# Patient Record
Sex: Female | Born: 1995 | Hispanic: Yes | Marital: Single | Smoking: Never smoker
Health system: Southern US, Community
[De-identification: ages and names within clinical notes are randomized; demographics above are authoritative.]

## PROBLEM LIST (undated history)

## (undated) DIAGNOSIS — F4310 Post-traumatic stress disorder, unspecified: Secondary | ICD-10-CM

## (undated) DIAGNOSIS — Z789 Other specified health status: Secondary | ICD-10-CM

## (undated) DIAGNOSIS — F4010 Social phobia, unspecified: Secondary | ICD-10-CM

## (undated) HISTORY — PX: BACK SURGERY: SHX140

---
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
## 2017-04-21 IMAGING — CT CT MAXILLOFACIAL W/O CM
3 series · 15 of 47 positions shown, 18 images · non-contrast
Comparison: None.

CLINICAL DATA: Pain and swelling in the right mandibular region, 1
year duration.

EXAM:
CT MAXILLOFACIAL WITHOUT CONTRAST
TECHNIQUE: Multidetector CT imaging of the maxillofacial structures was
performed. Multiplanar CT image reconstructions were also generated.
A small metallic BB was placed on the right temple in order to
reliably differentiate right from left.

[Series 4: maxofacial soft · axial · 0.29mm/px · z∈[-124,+23]mm · 9 of 57 slices shown, 12 images]
[im 4/57  brain]
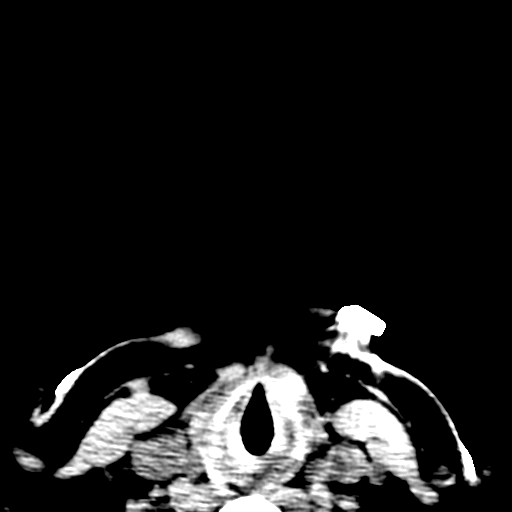
[im 4/57  bone]
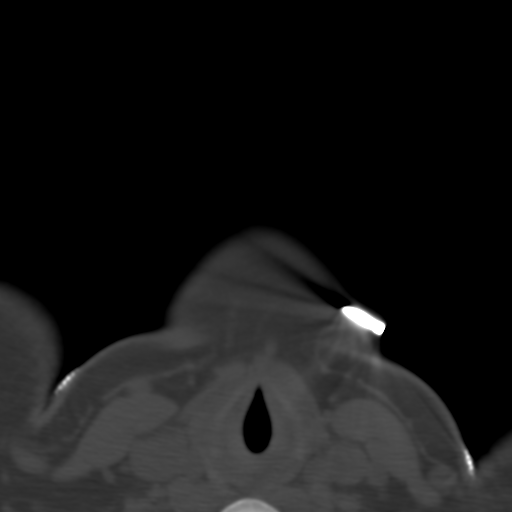
[im 10/57  bone]
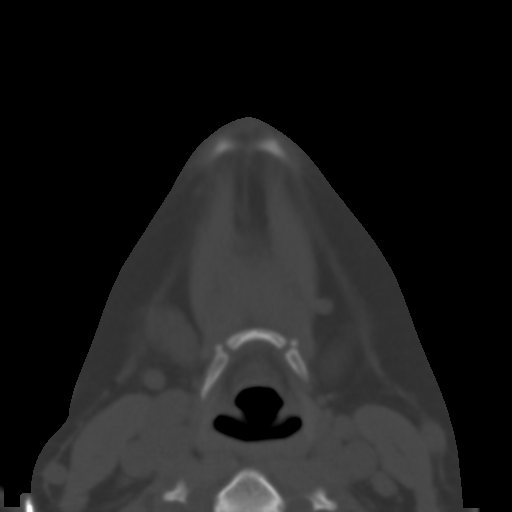
[im 16/57  bone]
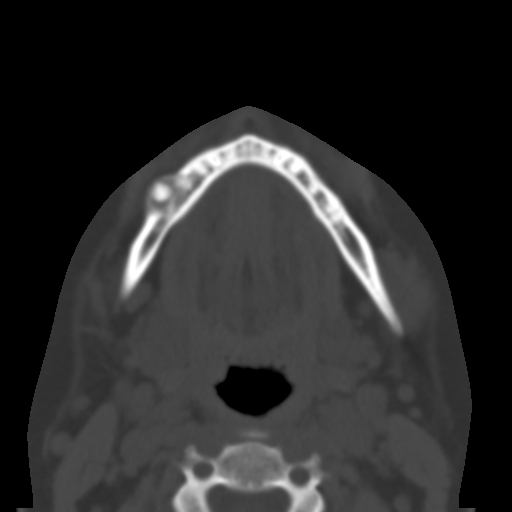
[im 22/57  bone]
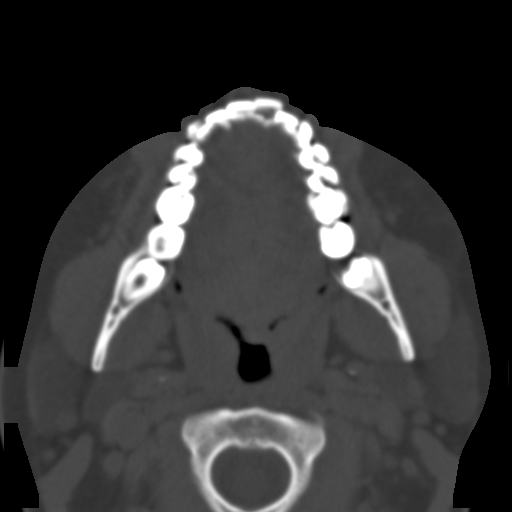
[im 29/57  brain]
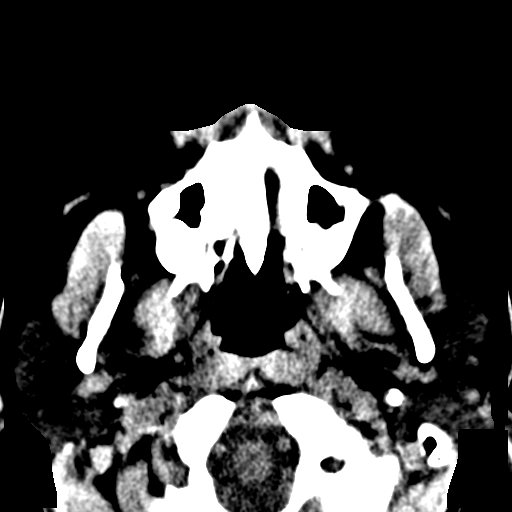
[im 29/57  bone]
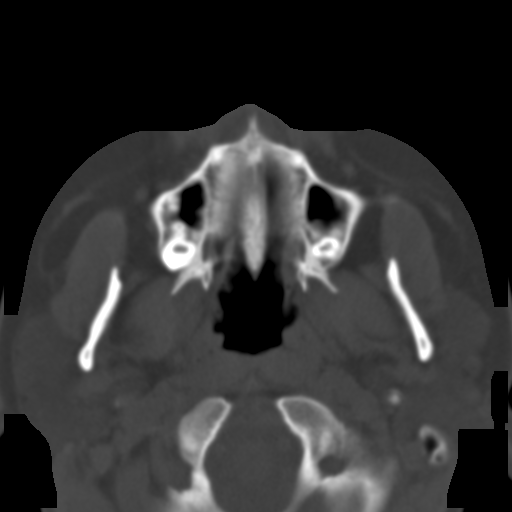
[im 35/57  bone]
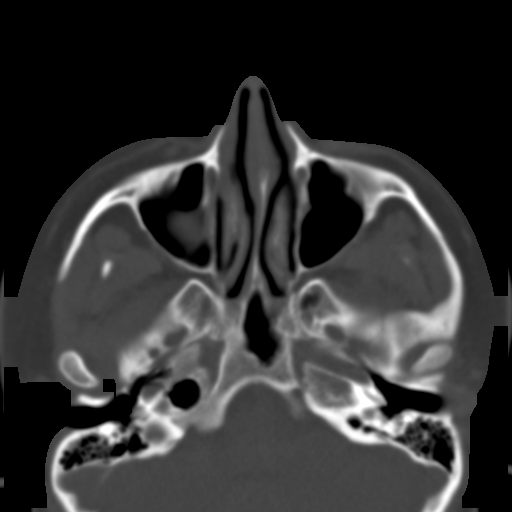
[im 41/57  bone]
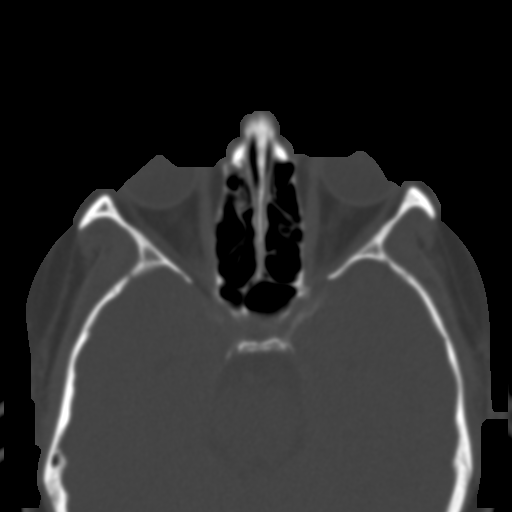
[im 47/57  bone]
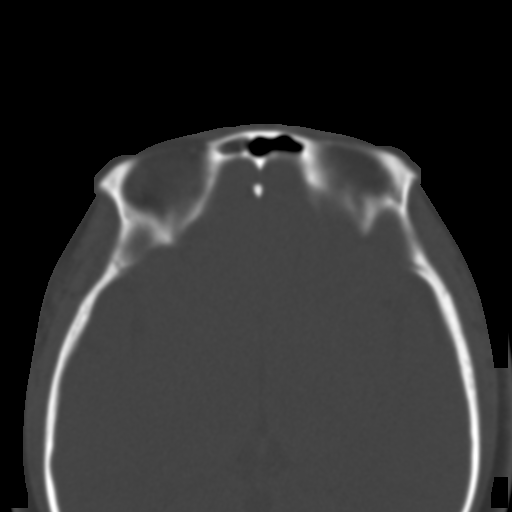
[im 53/57  brain]
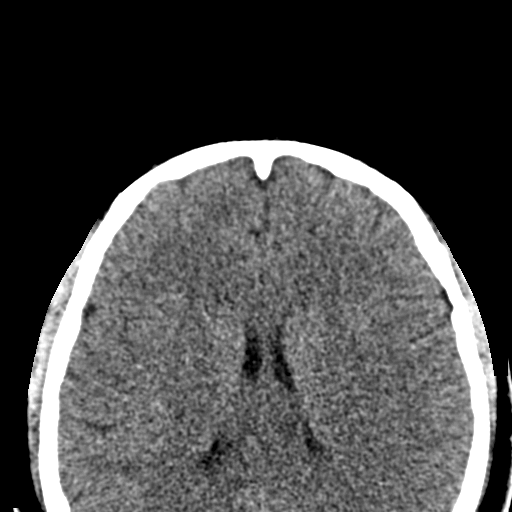
[im 53/57  bone]
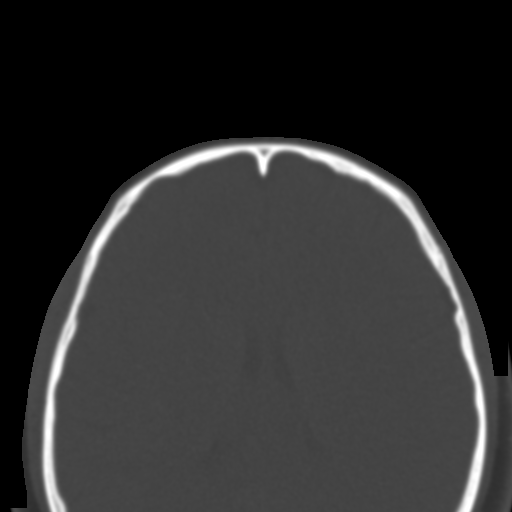

[Series 7: cor soft · coronal · 0.32mm/px · 3 of 69 slices shown]
[im 23/69  bone]
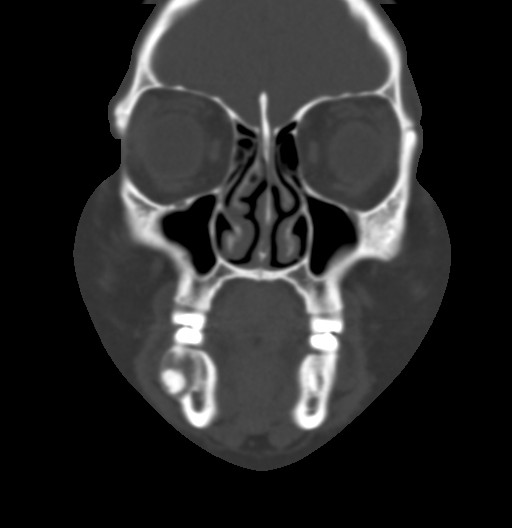
[im 31/69  bone]
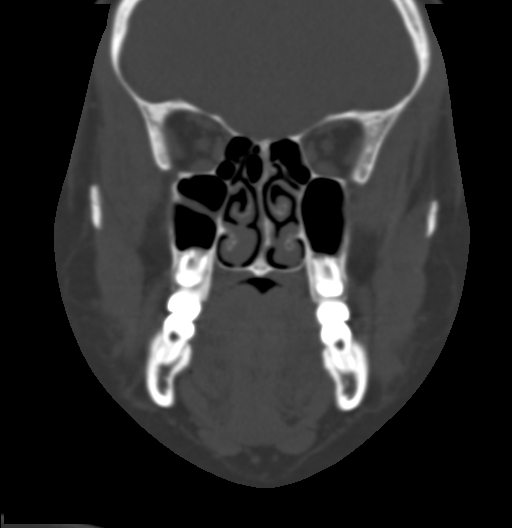
[im 38/69  bone]
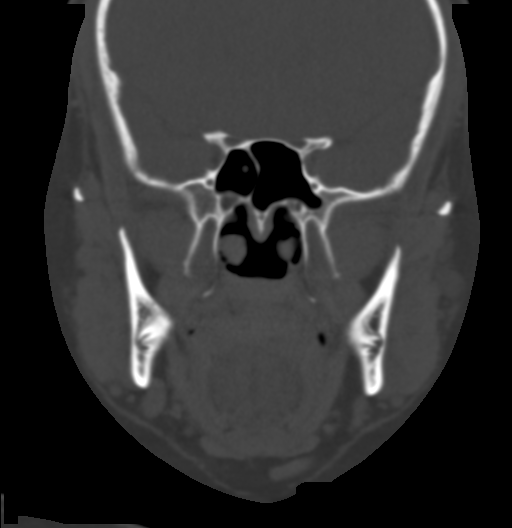

[Series 8: sag soft · sagittal · 0.27mm/px · 3 of 78 slices shown]
[im 26/78  bone]
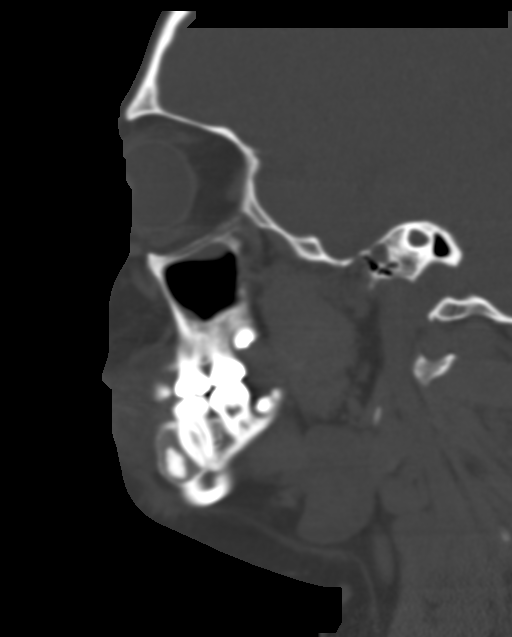
[im 39/78  bone]
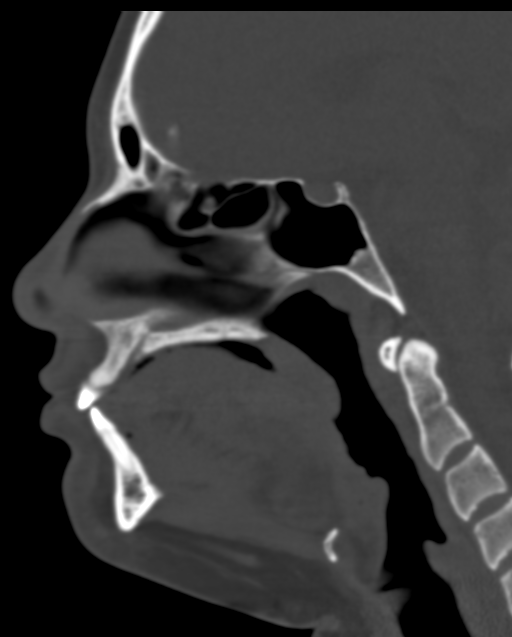
[im 52/78  bone]
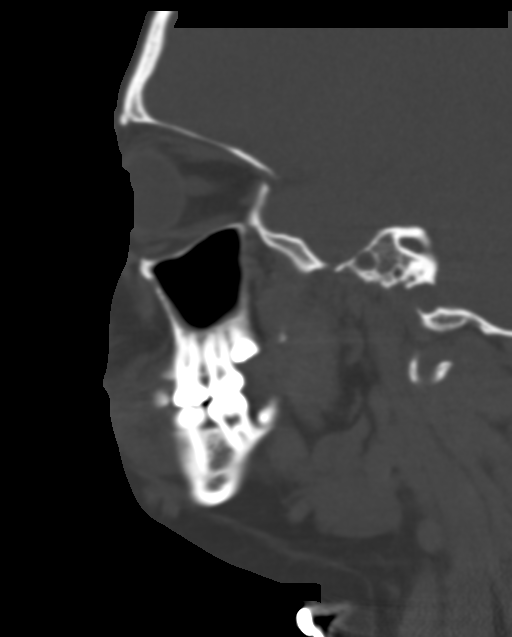

[15 of 47 positions shown; findings below may reference images not displayed]

FINDINGS: There is a 1.5 cm in size expansile lesion of the body of the
mandible on the right adjacent to an between the roots of teeth 29
and 30, more closely associated with number 30. Internally, there is
ground-glass opacity as well as a 6 mm focus of more dense
component. No definite cortical breakthrough. Adjacent soft tissues
are unremarkable. I think the most likely possibilities are odontoma
versus odontogenic myxoma. The presence of the more hyperdense
component is strongly suggestive of these diagnoses. Less likely
possibilities include ameloblastoma and odontogenic keratocyst, but
those are not usually associated with the hyperdense component.

The remainder of the mandible and maxilla are normal. No other soft
tissue finding. Mild mucosal thickening of the paranasal sinuses
without advanced sinusitis.
IMPRESSION: 1.5 cm expansile lesion affecting the body of the mandible on the
right, expanding the lateral cortex. This located between the roots
of teeth 29 and 30. See above for full discussion. Favored diagnosis
is odontoma versus odontogenic myxoma.

## 2017-11-11 ENCOUNTER — Ambulatory Visit: Payer: Self-pay | Admitting: Obstetrics & Gynecology

## 2017-11-13 ENCOUNTER — Ambulatory Visit: Payer: BLUE CROSS/BLUE SHIELD | Admitting: Obstetrics & Gynecology

## 2017-11-13 ENCOUNTER — Encounter: Payer: Self-pay | Admitting: Obstetrics & Gynecology

## 2017-11-13 VITALS — BP 130/84 | Ht 64.5 in | Wt 233.0 lb

## 2017-11-13 DIAGNOSIS — Z113 Encounter for screening for infections with a predominantly sexual mode of transmission: Secondary | ICD-10-CM | POA: Diagnosis not present

## 2017-11-13 DIAGNOSIS — Z01419 Encounter for gynecological examination (general) (routine) without abnormal findings: Secondary | ICD-10-CM

## 2017-11-13 DIAGNOSIS — Z30011 Encounter for initial prescription of contraceptive pills: Secondary | ICD-10-CM | POA: Diagnosis not present

## 2017-11-13 DIAGNOSIS — N921 Excessive and frequent menstruation with irregular cycle: Secondary | ICD-10-CM | POA: Diagnosis not present

## 2017-11-13 MED ORDER — NORETHIN ACE-ETH ESTRAD-FE 1-20 MG-MCG PO TABS: 1.0000 | ORAL_TABLET | Freq: Every day | ORAL | 4 refills | Status: DC
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
## 2017-11-13 NOTE — Patient Instructions (Signed)
1. Well female exam with routine gynecological exam Normal gynecologic exam.  Will start Pap test at age 22.  Breast exam normal.  Recommend regular physical activity with aerobic physical activity 5 times a week and weight lifting every 2 days.  2. Encounter for initial prescription of contraceptive pills Counseling on contraception.  Different method of contraception discussed including the birth control pill, the Nuva ring, the contraceptive patch, the Nexplanon, Depo-Provera, the IUD with and without progesterone.  Patient prefers the birth control pill.  Usage, risks and benefits thoroughly discussed.  Risks of blood clots and high blood pressure reviewed.  Importance of compliance discussed.  Patient voiced understanding and agreement with plan.  3. Screen for STD (sexually transmitted disease) Strict condom use strongly recommended. - HIV antibody (with reflex) - RPR - Hepatitis B Surface AntiGEN - Hepatitis C Surface AntiGEN - C. trachomatis/N. gonorrhoeae RNA  4. Menometrorrhagia Possible tendency for an ovulation.  Rule out anemia, thyroid dysfunction and hyperprolactinemia.  Will also follow-up for a pelvic ultrasound to make sure that the abnormal bleeding is not caused by fibroids or polyps.  Rule out pregnancy. - CBC - TSH - Prolactin - US Transvaginal Non-OB; Future - hCG, serum, qualitative  Other orders - norethindrone-ethinyl estradiol (JUNEL FE,GILDESS FE,LOESTRIN FE) 1-20 MG-MCG tablet; Take 1 tablet by mouth daily.  Lesly Rubenstein, fue un placer conocerle hoy!  Voy a informarle de sus [employer].   Informacin sobre los Electronics engineer (Oral Contraception Information) Los anticonceptivos orales (ACO) son medicamentos que se utilizan para Therapist, occupational. Su funcin es [employer] ovarios liberen vulos. Las hormonas de los ACO tambin hacen que el moco cervical se haga ms espeso, lo que evita que el esperma ingrese al tero. Tambin hacen que la  membrana que recubre internamente al tero se vuelva ms fina, lo que no permite que el huevo fertilizado se adhiera a la pared del tero. Los ACO son muy efectivos cuando se toman exactamente como se prescriben. Sin embargo, no previenen contra las enfermedades de transmisin sexual (ETS). La prctica del sexo seguro, como el uso de preservativos, junto con la [place], [place] a prevenir ese tipo de enfermedades. Antes de tomar la pldora, usted debe hacerse un examen fsico y un test de Pap. El mdico podr indicarle anlisis de [place], si es necesario. El mdico se asegurar de que usted sea Sneads buena candidata para usar anticonceptivos orales. Converse con su mdico acerca de los posibles efectos secundarios de los ACO que podran recetarle. Cuando se inicia el uso de ACO, puede llevar 2 a 3 meses para que su organismo se adapte a los cambios en los niveles hormonales. TIPOS DE ANTICONCEPTIVOS ORALES  Pldora combinada: esta pldora contiene las hormonas estrgeno y progestina (progesterona sinttica). La pldora combinada viene en envases para [address] [place]. Algunos tipos de pldoras combinadas deben tomarse de manera continua (pldoras para 365 das). En los envases para [address], usted no tomar las pldoras durante 7 das despus de la ltima pldora. En los envases para [address] la pldora se toma [employer]. Las ltimas 7 no contienen hormonas. Ciertos tipos de pldoras tienen ms de 21 pldoras que contienen hormonas. En los envases para [address] las primeras 84 pldoras contienen ambas hormonas y las ltimas 7 pldoras no contienen hormonas o contienen slo Therapist, occupational.  La minipldora: esta pldora contiene la hormona progesterona solamente. Es necesario tomarla todos los das de [place] continua. Es [employer]  tome a la [employer]. Viene en envases de 28 pldoras. Las 28 pldoras contienen la hormona. Lucas los  sntomas premenstruales.  Se [place] para tratar los [employer].  Regula el ciclo menstrual.  Disminuye el ciclo menstrual abundante.  Puede mejorar el acn, segn el tipo de pldora.  Trata hemorragias uterinas anormales.  Trata el sndrome ovrico poliqustico.  Trata la endometriosis.  Pueden usarse como anticonceptivo de Freight forwarder. FACTORES QUE PUEDEN HACER QUE LOS ANTICONCEPTIVOS ORALES SEAN MENOS EFECTIVOS Pueden ser menos efectivos si:  Olvid tomar la [employer] a la misma hora.  Tiene una enfermedad estomacal o intestinal que disminuye la absorcin de la pldora.  Ingiere simultneamente los anticonceptivos orales junto con otros medicamentos que los hacen menos efectivos, como antibiticos, ciertos medicamentos para el VIH y algunos medicamentos para las convulsiones.  Usted toma anticonceptivos orales que han vencido.  Cuando se [place] el envase de 21 das, se olvida de recomenzar el uso [employer] 7. RIESGOS ASOCIADOS AL USO DE ANTICONCEPTIVOS ORALES Los anticonceptivos orales pueden en algunos casos causar efectos secundarios como:  Dolor de [place].  Nuseas.  Inflamacin mamaria.  Hemorragia vaginal o manchado irregular. Las pldoras combinadas tambin se asocian a un pequeo aumento en el riesgo de:  Cogulos sanguneos.  Ataque cardaco.  Ictus. Esta informacin no tiene Marine scientist el consejo del mdico. Asegrese de hacerle al mdico cualquier pregunta que tenga. Document Released: 05/08/2005 Document Revised: 11/20/2015 Document Reviewed: 01/17/2013 Elsevier Interactive Patient Education  2018 [place] preventivos en las mujeres adultas jvenes Preventive Care for [place], Female La transicin hacia la vida de adulto joven despus de la escuela secundaria puede ser una poca estresante que implica muchos cambios. Ahora puede comenzar a Teacher, adult education a un mdico de atencin Audiological scientist de un pediatra.  Este es el momento en que el cuidado de la salud se convierte en su responsabilidad. Los cuidados preventivos hacen referencia a las opciones en cuanto al estilo de vida y a las visitas al mdico, las cuales pueden promover la salud y Musician. Qu incluyen los cuidados preventivos?  Un examen fsico anual. Esto tambin se conoce como visita de control de bienestar anual.  Exmenes dentales [place] al ao.  Exmenes de la vista de rutina. Pregntele al mdico con qu frecuencia debe realizarse un control de la vista.  Opciones personales de estilo de vida, que incluyen lo siguiente: ? [employer] y las encas a diario. ? Realizar actividad fsica con regularidad. ? Tener una dieta saludable. ? Evitar el consumo de tabaco y drogas. ? Evitar o limitar el consumo de alcohol. ? Counsellor. ? Tomar los suplementos de vitaminas o minerales como se lo haya indicado el mdico. Qu sucede durante una visita de control de bienestar anual? Los cuidados preventivos comienzan con una visita anual a su mdico de Midwife. Los servicios y exmenes de deteccin realizados por su mdico durante la visita de control de Therapist, sports anual dependern de su salud general, factores de riesgo de estilo de vida y los antecedentes familiares de enfermedades. Asesoramiento Su mdico puede preguntarle acerca de:  Problemas mdicos anteriores y sus antecedentes mdicos familiares.  Los medicamentos o suplementos que toma.  Seguro mdico y Paramedic a la atencin mdica.  Consumo de tabaco, alcohol y drogas.  Su seguridad en el hogar, el trabajo o la escuela.  Acceso a armas  de fuego.  Su bienestar emocional y cmo Arboriculturist.  El bienestar de las relaciones personales.  Hbitos de alimentacin, ejercicio y sueo.  Su salud y [place] sexual.  Sus mtodos de control de la natalidad (mtodo anticonceptivo).  Su ciclo menstrual.  Sus antecedentes de  Media planner.  Pruebas de deteccin Pueden hacerle las siguientes pruebas o mediciones:  Estatura, peso e ndice de masa muscular [facility]).  Presin arterial.  Niveles de lpidos y colesterol.  Prueba cutnea de tuberculosis.  Examen de la piel.  Pruebas de la vista y la audicin.  Prueba de deteccin de hepatitis.  Pruebas de deteccin de enfermedades de transmisin sexual (ETS), si est en riesgo.  Pruebas de deteccin de cncer relacionado con las mutaciones del BRCA. Es posible que se los deba realizar si tiene antecedentes de cncer de mama, de ovario, de trompas o peritoneal.  Examen plvico y prueba de Papanicolaou. Esto se puede realizar cada 62aos a Renato Gails de los 21aos de edad.  Vacunas El mdico puede recomendarle que se aplique algunas vacunas, por ejemplo:  Vacuna contra la gripe. Se recomienda aplicarse esta vacuna todos los aos.  Vacuna contra la difteria, ttanos y tos Dietitian (DTPa, DT). Es posible que tenga que aplicarse un refuerzo contra el ttanos y la difteria (DT) cada 10aos.  Vacuna contra la varicela. Es posible que tenga que aplicrsela si no recibi esta vacuna.  Lucile Crater el VPH. Si es menor de 26aos, puede que necesite aplicarse tres dosis en un periodo de 27mses.  Vacuna contra el sarampin, rubola y paperas (SRP). Tendr que aplicarse por lo menos una dosis de la SRP. Podra tambin necesitar una segunda dosis.  Vacuna antineumoccica conjugada 13 valente (PCV13). Puede necesitar esta vacuna si tiene determinadas enfermedades y no se vacun anteriormente.  Vacuna antineumoccica de polisacridos (PPSV23). Quizs tenga que aplicarse una o dos dosis si fuma o si sufre determinadas enfermedades.  Vacuna antimeningoccica. Se recomienda la aplicacin de uArdelia Memsdosis si tiene entre 19 y 21aos, y es estudiante universitario de pPsychologist, educationalao que vive en una residencia estudiantil, o si tiene una de varias enfermedades. Podra tambin  necesitar dosis de refuerzo.  Vacuna contra la hepatitis A. Es posible que necesite esta vacuna si tiene ciertas afecciones o si viaja o trabaja en lugares en los que podra estar expuesto a la hepatitis A.  Vacuna contra la hepatitis B. Es posible que necesite esta vacuna si tiene ciertas afecciones o si viaja o trabaja en lugares en los que podra estar expuesto a la hepatitis B.  Vacuna contra antihaemophilus influenzae tipoB (Hib). Es posible que necesite esta vacuna si tiene determinados factores de rHenderson  Hable con el mdico sobre qu pruebas de deteccin y qu vacunas necesita, y con qu frecuencia las necesita. Qu pasos debo seguir para desarrollar conductas saludables?  Realice visitas peridicas de cuidados de salud preventivos al mdico de atencin primaria y al dentista.  Tenga una dieta saludable.  Beba suficiente lquido para mConsulting civil engineerorina clara o de color amarillo plido.  Mantngase activa. Haga al menos 361mutos de ejercicio 5o ms daHilton Hotels Consuma alcohol en forma responsable.  Mantenga un peso saludable.  No use ningn producto que contenga nicotina, como cigarrillos, tabaco de maHigher education careers adviser ciPsychologist, sport and exerciseSi necesita ayuda para dejar de fumar, consulte al mdico.  No consuma drogas.  Practique el sexo seguro.  Utilice mtodos de control de la natalidad (anticonceptivos) para evitar un embarazo no deseado. Si busca un  embarazo, realice una consulta previa a la concepcin con el mdico.  Busque formas saludables para Scientific laboratory technician. Cmo puedo protegerme de las lesiones? Las lesiones producidas en situaciones violentas o accidentes son la causa principal de muerte entre adultos jvenes y, con frecuencia, se Museum/gallery exhibitions officer. Siga estos pasos para protegerse:  Use siempre el cinturn de seguridad al conducir o viajar en un vehculo.  No conduzca si estuvo bebiendo alcohol. No viaje con un conductor que ha estado bebiendo  alcohol.  No conduzca cuando est cansada o distrada. No enve mensajes de texto mientras conduce.  Use un casco y otros equipos de proteccin durante actividades deportivas.  Si tiene armas de fuego en su casa, asegrese de seguir todos los procedimientos de seguridad correspondientes.  Busque ayuda si fue vctima de acoso, abuso fsico o abuso sexual.  Programmer, systems Internet con responsabilidad para evitar peligros, como el acoso y los depredadores sexuales en lnea.  Qu puedo hacer para manejar el estrs? Los adultos jvenes pueden enfrentar muchos desafos nuevos que pueden ser estresantes, Games developer un [place], ir a la [place], [employer] de la casa de [place], Architectural technologist dinero, tener una relacin de [place], casarse y tener hijos. Para controlar el estrs puede hacer lo siguiente:  Evite situaciones estresantes conocidas siempre que pueda.  Haga ejercicios regularmente.  Encuentre una actividad que reduzca el estrs y que funcione mejor para usted. Los ejemplos [employer], yoga, Conservation officer, nature o leer.  Pase tiempo en la naturaleza.  Lleve un registro diario sobre su estrs y cmo usted Psychiatrist.  Consulte al [employer]. El mdico puede sugerirle que reciba asistencia psicolgica.  Pase tiempo con amigos o familiares que le brinden apoyo.  No intente controlar el estrs: ? Consumiendo alcohol o drogas. ? Fumando. ? Comiendo.  Dnde puedo obtener ms informacin? Obtenga ms informacin sobre cuidados preventivos y hbitos saludables aqu:  Colegio Estadounidense de [place] y Youth worker ([place], broadcasting/film/video of Obstetricians and Gynecologists): KaraokeExchange.nl  [place] de Associate Professor de Servicios Preventivos de IT trainer. (U.S. Therapist, occupational): StageSync.si  WESCO International de Informacin de Salud de Adolescentes y Engineer, technical sales (National  Adolescent and [place]): StrategicRoad.nl  [place] ([place] Academy of Pediatrics Bright Futures): https://brightfutures.MemberVerification.co.za  Sociedad de Salud y Lilly (Society for Adolescent Health and Medicine): MoralBlog.co.za.aspx  PodExchange.nl: ToyLending.fr  Esta informacin no tiene como fin reemplazar el consejo del mdico. Asegrese de hacerle al mdico cualquier pregunta que tenga. Document Released: 12/10/2016 Document Revised: 12/10/2016 Document Reviewed: 12/14/2015 Elsevier Interactive Patient Education  2018 [employer].

## 2017-11-14 LAB — CBC
HEMATOCRIT: 37 % (ref 35.0–45.0)
HEMOGLOBIN: 12 g/dL (ref 11.7–15.5)
MCH: 25.7 pg — AB (ref 27.0–33.0)
MCHC: 32.4 g/dL (ref 32.0–36.0)
MCV: 79.2 fL — AB (ref 80.0–100.0)
MPV: 10.7 fL (ref 7.5–12.5)
Platelets: 333 10*3/uL (ref 140–400)
RBC: 4.67 10*6/uL (ref 3.80–5.10)
RDW: 14 % (ref 11.0–15.0)
WBC: 9.8 10*3/uL (ref 3.8–10.8)

## 2017-11-14 LAB — HEPATITIS C ANTIBODY
HEP C AB: NONREACTIVE
SIGNAL TO CUT-OFF: 0.01 (ref <1.00)

## 2017-11-14 LAB — HIV ANTIBODY (ROUTINE TESTING W REFLEX): HIV 1&2 Ab, 4th Generation: NONREACTIVE

## 2017-11-14 LAB — C. TRACHOMATIS/N. GONORRHOEAE RNA
C. trachomatis RNA, TMA: NOT DETECTED
N. GONORRHOEAE RNA, TMA: NOT DETECTED

## 2017-11-14 LAB — PROLACTIN: Prolactin: 7.2 ng/mL

## 2017-11-14 LAB — RPR: RPR Ser Ql: NONREACTIVE

## 2017-11-14 LAB — HCG, SERUM, QUALITATIVE: PREG SERUM: NEGATIVE

## 2017-11-14 LAB — HEPATITIS B SURFACE ANTIGEN: Hepatitis B Surface Ag: NONREACTIVE

## 2017-11-14 LAB — TSH: TSH: 1.5 mIU/L

## 2017-12-15 ENCOUNTER — Encounter: Payer: Self-pay | Admitting: *Deleted

## 2018-04-02 ENCOUNTER — Encounter (HOSPITAL_COMMUNITY): Payer: Self-pay

## 2018-04-02 ENCOUNTER — Emergency Department (HOSPITAL_COMMUNITY): Payer: BLUE CROSS/BLUE SHIELD

## 2018-04-02 ENCOUNTER — Emergency Department (HOSPITAL_COMMUNITY)
Admission: EM | Admit: 2018-04-02 | Discharge: 2018-04-02 | Disposition: A | Discharge status: Home / Self Care | Payer: BLUE CROSS/BLUE SHIELD | Attending: Emergency Medicine | Admitting: Emergency Medicine

## 2018-04-02 ENCOUNTER — Other Ambulatory Visit: Payer: Self-pay

## 2018-04-02 DIAGNOSIS — R197 Diarrhea, unspecified: Secondary | ICD-10-CM | POA: Insufficient documentation

## 2018-04-02 DIAGNOSIS — R112 Nausea with vomiting, unspecified: Secondary | ICD-10-CM | POA: Diagnosis not present

## 2018-04-02 DIAGNOSIS — R1084 Generalized abdominal pain: Secondary | ICD-10-CM | POA: Insufficient documentation

## 2018-04-02 LAB — CBC
HEMATOCRIT: 42.8 % (ref 36.0–46.0)
HEMOGLOBIN: 13.3 g/dL (ref 12.0–15.0)
MCH: 24.9 pg — ABNORMAL LOW (ref 26.0–34.0)
MCHC: 31.1 g/dL (ref 30.0–36.0)
MCV: 80.1 fL (ref 78.0–100.0)
Platelets: 300 10*3/uL (ref 150–400)
RBC: 5.34 MIL/uL — ABNORMAL HIGH (ref 3.87–5.11)
RDW: 17.1 % — ABNORMAL HIGH (ref 11.5–15.5)
WBC: 10.9 10*3/uL — ABNORMAL HIGH (ref 4.0–10.5)

## 2018-04-02 LAB — COMPREHENSIVE METABOLIC PANEL
ALBUMIN: 3.8 g/dL (ref 3.5–5.0)
ALT: 24 U/L (ref 0–44)
ANION GAP: 10 (ref 5–15)
AST: 24 U/L (ref 15–41)
Alkaline Phosphatase: 92 U/L (ref 38–126)
BILIRUBIN TOTAL: 0.5 mg/dL (ref 0.3–1.2)
BUN: 9 mg/dL (ref 6–20)
CO2: 25 mmol/L (ref 22–32)
Calcium: 9.5 mg/dL (ref 8.9–10.3)
Chloride: 106 mmol/L (ref 98–111)
Creatinine, Ser: 0.74 mg/dL (ref 0.44–1.00)
GFR calc Af Amer: >60 mL/min (ref >60)
GFR calc non Af Amer: >60 mL/min (ref >60)
GLUCOSE: 103 mg/dL — AB (ref 70–99)
POTASSIUM: 4 mmol/L (ref 3.5–5.1)
SODIUM: 141 mmol/L (ref 135–145)
TOTAL PROTEIN: 7.6 g/dL (ref 6.5–8.1)

## 2018-04-02 LAB — I-STAT BETA HCG BLOOD, ED (MC, WL, AP ONLY): I-stat hCG, quantitative: <5 m[IU]/mL (ref <5)

## 2018-04-02 LAB — LIPASE, BLOOD: Lipase: 23 U/L (ref 11–51)

## 2018-04-02 MED ORDER — IOPAMIDOL (ISOVUE-300) INJECTION 61%
100.0000 mL | Freq: Once | INTRAVENOUS | Status: AC | PRN
  Administered 2018-04-02: 100 mL via INTRAVENOUS

## 2018-04-02 MED ORDER — ONDANSETRON 8 MG PO TBDP: 8.0000 mg | ORAL_TABLET | Freq: Three times a day (TID) | ORAL | 0 refills | Status: DC | PRN

## 2018-04-02 MED ORDER — IOPAMIDOL (ISOVUE-300) INJECTION 61%
INTRAVENOUS | Status: AC
  Filled 2018-04-02: qty 100

## 2018-04-02 MED ORDER — SODIUM CHLORIDE 0.9 % IV BOLUS
1000.0000 mL | Freq: Once | INTRAVENOUS | Status: AC
  Administered 2018-04-02: 1000 mL via INTRAVENOUS

## 2018-04-02 NOTE — ED Notes (Signed)
One unsuccessful iv attempt made.

## 2018-04-02 NOTE — ED Provider Notes (Signed)
[facility] DEPT Provider Note   CSN: [id number] Arrival date & time: 04/02/18  1352     History   Chief Complaint Chief Complaint  Patient presents with  . Emesis  . Abdominal Pain  . Abnormal Lab  . Headache    HPI Amy Mercer is a 22 y.o. female.  HPI  Amy Mercer is a 22 y.o. female presents to emergency department complaining of abdominal pain.  Patient states she has had 3 days of diffuse abdominal cramping, nausea, vomiting, diarrhea.  She states she is having a loose bowel movement every hour.  She went to her primary care doctor yesterday, had some blood work done and was told to come here for further evaluation today.  I reviewed the blood work, and the only main abnormality is patient's white blood cell count was 20.  Patient states she is not feeling any better today, she continues to have nausea, vomiting, diarrhea and abdominal pain.  She is not pregnant.  She was prescribed "some type of medication for my symptoms but they are not helping."  She denies any fever or chills.  No urinary symptoms.  No prior abdominal issues or surgeries.  Spanish interpreter used for this visit    Past Medical History:  Diagnosis Date  . Medical history non-contributory   . PTSD (post-traumatic stress disorder) 08/15/2015  . Social anxiety disorder 08/15/2015    Patient Active Problem List   Diagnosis Date Noted  . MDD (major depressive disorder) 08/15/2015  . Social anxiety disorder 08/15/2015  . PTSD (post-traumatic stress disorder) 08/15/2015  . Vasovagal syncope 09/16/2014  . Transient right leg weakness 09/16/2014  . Numbness in right leg 09/16/2014  . Obesity 09/16/2014    Past Surgical History:  Procedure Laterality Date  . BACK SURGERY    . OTHER SURGICAL HISTORY  2006   Infected boil removed from the back of her neck and continued needing to have 3 additional surgeries at the age of 61.     OB History    Gravida  0   Para  0   Term  0   Preterm  0   AB  0   Living  0     SAB  0   TAB  0   Ectopic  0   Multiple  0   Live Births  0            Home Medications    Prior to Admission medications   Medication Sig Start Date End Date Taking? Authorizing Provider  norethindrone-ethinyl estradiol (JUNEL FE,GILDESS FE,LOESTRIN FE) 1-20 MG-MCG tablet Take 1 tablet by mouth daily. 11/13/17   Princess Bruins, MD    Family History Family History  Problem Relation Age of Onset  . Heart attack Paternal Grandmother        Died at 55    Social History Social History   Tobacco Use  . Smoking status: Never Smoker  . Smokeless tobacco: Never Used  Substance Use Topics  . Alcohol use: Yes    Alcohol/week: 0.0 standard drinks    Comment: RARE  . Drug use: No     Allergies   Other   Review of Systems Review of Systems  Constitutional: Negative for chills and fever.  Respiratory: Negative for cough, chest tightness and shortness of breath.   Cardiovascular: Negative for chest pain, palpitations and leg swelling.  Gastrointestinal: Positive for abdominal pain, nausea and vomiting. Negative for diarrhea.  Genitourinary: Negative for dysuria, flank pain, pelvic pain, vaginal bleeding, vaginal discharge and vaginal pain.  Musculoskeletal: Negative for arthralgias, myalgias, neck pain and neck stiffness.  Skin: Negative for rash.  Neurological: Negative for dizziness, weakness and headaches.  All other systems reviewed and are negative.    Physical Exam Updated Vital Signs BP 127/77 (BP Location: Right Arm)   Pulse 92   Temp 98 F (36.7 C) (Oral)   Resp 16   Ht 5\' 5"  (1.651 m)   Wt 108.9 kg   LMP 02/28/2018   SpO2 100%   BMI 39.94 kg/m   Physical Exam  Constitutional: She appears well-developed and well-nourished. No distress.  HENT:  Head: Normocephalic.  Eyes: Conjunctivae are normal.  Neck: Neck supple.  Cardiovascular: Normal rate, regular rhythm  and normal heart sounds.  Pulmonary/Chest: Effort normal and breath sounds normal. No respiratory distress. She has no wheezes. She has no rales.  Abdominal: Soft. Bowel sounds are normal. She exhibits no distension. There is generalized tenderness. There is no rigidity, no rebound and no guarding.  Musculoskeletal: She exhibits no edema.  Neurological: She is alert.  Skin: Skin is warm and dry.  Psychiatric: She has a normal mood and affect. Her behavior is normal.  Nursing note and vitals reviewed.    ED Treatments / Results  Labs (all labs ordered are listed, but only abnormal results are displayed) Labs Reviewed  COMPREHENSIVE METABOLIC PANEL - Abnormal; Notable for the following components:      Result Value   Glucose, Bld 103 (*)    All other components within normal limits  CBC - Abnormal; Notable for the following components:   WBC 10.9 (*)    RBC 5.34 (*)    MCH 24.9 (*)    RDW 17.1 (*)    All other components within normal limits  LIPASE, BLOOD  URINALYSIS, ROUTINE W REFLEX MICROSCOPIC  I-STAT BETA HCG BLOOD, ED ([facility], [facility], [facility] ONLY)    EKG None  Radiology Ct Abdomen Pelvis W Contrast  Result Date: 04/02/2018 CLINICAL DATA:  Acute generalized abdominal pain, abnormal labs possible pancreatitis, abdominal pain for 2 days, headache, dizziness EXAM: CT ABDOMEN AND PELVIS WITH CONTRAST TECHNIQUE: Multidetector CT imaging of the abdomen and pelvis was performed using the standard protocol following bolus administration of intravenous contrast. Sagittal and coronal MPR images reconstructed from axial data set. CONTRAST:  170mL ISOVUE-300 IOPAMIDOL (ISOVUE-300) INJECTION 61% IV. No oral contrast. COMPARISON:  None FINDINGS: Lower chest: Lung bases clear Hepatobiliary: Gallbladder and liver normal appearance Pancreas: Normal appearance. Specifically no peripancreatic edema to suggest pancreatitis. No pancreatic mass or abnormal fluid collection. Spleen: Normal appearance  Adrenals/Urinary Tract: Adrenal glands normal appearance. Kidneys, ureters, and bladder normal appearance Stomach/Bowel: Normal appendix. Stomach and bowel loops normal appearance Vascular/Lymphatic: Vascular structures unremarkable. No adenopathy. Reproductive: Normal appearing uterus and ovaries Other: No free air free fluid.  No acute inflammatory process. Musculoskeletal: Bones unremarkable. IMPRESSION: Normal exam. Electronically Signed   By: Lavonia Dana M.D.   On: 04/02/2018 18:01    Procedures Procedures (including critical care time)  Medications Ordered in ED Medications  sodium chloride 0.9 % bolus 1,000 mL (has no administration in time range)     Initial Impression / Assessment and Plan / ED Course  I have reviewed the triage vital signs and the nursing notes.  Pertinent labs & imaging results that were available during my care of the patient were reviewed by me and considered in my medical decision making (see  chart for details).    Patient with diffuse abdominal pain and tenderness on exam.  Had a white count of 20 earlier yesterday.  We will recheck labs and get CT abdomen pelvis.   Patient CT is unremarkable.  Most likely gastroenteritis given nausea, vomiting, diarrhea.  She has normal vital signs.  She is drinking fluids with no difficulty.  Discussed results and plan with the patient, discharged home with Zofran, follow-up as needed.  Patient agreed.  Vital signs are stable at time of discharge.  Vitals:   04/02/18 1413 04/02/18 1415 04/02/18 1919  BP: 127/77  119/76  Pulse: 92  89  Resp: 16  16  Temp: 98 F (36.7 C)    TempSrc: Oral    SpO2: 100%  100%  Weight:  108.9 kg   Height:  5\' 5"  (1.651 m)      Final Clinical Impressions(s) / ED Diagnoses   Final diagnoses:  Nausea vomiting and diarrhea    ED Discharge Orders         Ordered    ondansetron (ZOFRAN ODT) 8 MG disintegrating tablet  Every 8 hours PRN     04/02/18 1900           Jeannett Senior, PA-C 04/02/18 2317    Milton Ferguson, MD 04/02/18 2324

## 2018-04-02 NOTE — Discharge Instructions (Addendum)
Drink plenty of fluids. Rest. Zofran as prescribed as needed for nausea and vomiting. Follow up with family doctor as needed.

## 2018-04-02 NOTE — ED Triage Notes (Addendum)
Patient was sent by her physician because of abnornal lab, possible pancreatitis. Patient also c/o headache and dizziness.and abdominal pain x 2 days.   Triage completed using video interpreter [phone]

## 2019-03-15 ENCOUNTER — Other Ambulatory Visit: Payer: Self-pay

## 2019-03-15 DIAGNOSIS — Z20822 Contact with and (suspected) exposure to covid-19: Secondary | ICD-10-CM

## 2019-03-16 LAB — NOVEL CORONAVIRUS, NAA: SARS-CoV-2, NAA: NOT DETECTED

## 2019-03-25 ENCOUNTER — Telehealth (INDEPENDENT_AMBULATORY_CARE_PROVIDER_SITE_OTHER): Payer: Self-pay | Admitting: Primary Care

## 2019-03-25 ENCOUNTER — Encounter (INDEPENDENT_AMBULATORY_CARE_PROVIDER_SITE_OTHER): Payer: Self-pay | Admitting: Primary Care

## 2019-03-25 ENCOUNTER — Other Ambulatory Visit: Payer: Self-pay

## 2019-03-25 DIAGNOSIS — Z7689 Persons encountering health services in other specified circumstances: Secondary | ICD-10-CM

## 2019-03-25 DIAGNOSIS — G43109 Migraine with aura, not intractable, without status migrainosus: Secondary | ICD-10-CM

## 2019-03-25 MED ORDER — SUMATRIPTAN SUCCINATE 50 MG PO TABS: 50.0000 mg | ORAL_TABLET | ORAL | 0 refills | Status: DC | PRN

## 2019-03-25 NOTE — Progress Notes (Signed)
Pt reports headaches with great pressure that cause dizziness. Needs note for work if headache persists.

## 2019-03-25 NOTE — Progress Notes (Signed)
Virtual Visit via Telephone Note  I connected with Amy Mercer on 03/25/19 at  9:50 AM EDT by WEB and verified that I am speaking with the correct person using two identifiers.   I discussed the limitations, risks, security and privacy concerns of performing an evaluation and management service by telephone and the availability of in person appointments. I also discussed with the patient that there may be a patient responsible charge related to this service. The patient expressed understanding and agreed to proceed.   History of Present Illness: Ms.Amy Mercer is establishing care. She is concern that her COVID test was negative but she continues to have to have dizzy spells in which the room is spending around and than she has a headache and photosensitivity  Observations/Objective: Review of Systems  Neurological: Positive for dizziness, weakness and headaches.  All other systems reviewed and are negative.   Assessment and Plan: Amy Mercer was seen today for establish care.  Diagnoses and all orders for this visit:  Encounter to establish care Previously receiving care by pediatrician now establing care with new primary care as an adult. 11/13/2017 patient had a well female exam by  Dr. Crosby Oyster.  Migraine with aura and without status migrainosus, not intractable Signs can be individualized with throbbing pain on any area of the head.  They may come with an aura-dizziness, nausea, sensitive to light sound or smell.  Triggers can be caused by drinking alcohol smoking or certain medications, eating and drinking certain products  This condition may be triggered or caused by: Caffeine, aged cheese, and chocolate.  Other orders -     SUMAtriptan (IMITREX) 50 MG tablet; Take 1 tablet (50 mg total) by mouth every 2 (two) hours as needed for migraine. May repeat in 2 hours if headache persists or recurs.    Follow Up Instructions:    I discussed the  assessment and treatment plan with the patient. The patient was provided an opportunity to ask questions and all were answered. The patient agreed with the plan and demonstrated an understanding of the instructions.   The patient was advised to call back or seek an in-person evaluation if the symptoms worsen or if the condition fails to improve as anticipated.  I provided 20 minutes of non-face-to-face time during this encounter.   Kerin Perna, NP

## 2019-04-26 ENCOUNTER — Encounter (INDEPENDENT_AMBULATORY_CARE_PROVIDER_SITE_OTHER): Payer: Self-pay | Admitting: Primary Care

## 2019-04-26 ENCOUNTER — Other Ambulatory Visit (INDEPENDENT_AMBULATORY_CARE_PROVIDER_SITE_OTHER): Payer: Self-pay | Admitting: Primary Care

## 2019-04-26 ENCOUNTER — Telehealth (INDEPENDENT_AMBULATORY_CARE_PROVIDER_SITE_OTHER): Payer: BLUE CROSS/BLUE SHIELD | Admitting: Primary Care

## 2019-04-26 DIAGNOSIS — F32A Depression, unspecified: Secondary | ICD-10-CM

## 2019-04-26 DIAGNOSIS — G43109 Migraine with aura, not intractable, without status migrainosus: Secondary | ICD-10-CM

## 2019-04-26 DIAGNOSIS — F329 Major depressive disorder, single episode, unspecified: Secondary | ICD-10-CM

## 2019-04-26 MED ORDER — SUMATRIPTAN SUCCINATE 50 MG PO TABS: 50.0000 mg | ORAL_TABLET | ORAL | 0 refills | Status: DC | PRN

## 2019-04-26 NOTE — Progress Notes (Signed)
Headaches mainly in the afternoon around 2:30 and lasts all night  Sumatriptan not really helping

## 2019-04-26 NOTE — Progress Notes (Signed)
Virtual Visit via Telephone Note  I connected with Amy Mercer on 04/26/19 at 10:10 AM EDT by telephone and verified that I am speaking with the correct person using two identifiers.   I discussed the limitations, risks, security and privacy concerns of performing an evaluation and management service by telephone and the availability of in person appointments. I also discussed with the patient that there may be a patient responsible charge related to this service. The patient expressed understanding and agreed to proceed.  WEB visit History of Present Illness: [name] is having a web visit for recurrent headaches/migraines. The more questions asked her the headaches occur when she is not at work and nothing to do. We discussed depression and encourage her to exercise find a hobby that she enjoys and I would like to have her in person to discuss how and what's she doing on her time off.   Past Medical History:  Diagnosis Date  . Medical history non-contributory   . PTSD (post-traumatic stress disorder) 08/15/2015  . Social anxiety disorder 08/15/2015   Observations/Objective: Review of Systems  Constitutional: Positive for malaise/fatigue.  Neurological: Positive for dizziness and headaches.  Psychiatric/Behavioral: Positive for depression.    Assessment and Plan: Amy Mercer was seen today for follow-up.  Diagnoses and all orders for this visit:  Migraine with aura and without status migrainosus, not intractable Signs can be individualized with throbbing pain on any area of the head.  They may come with an aura-dizziness, nausea, sensitive to light sound or smell.  Triggers can be caused by drinking alcohol smoking or certain medications, eating and drinking certain products  This condition may be triggered or caused by: Caffeine, aged cheese, and chocolate.  Depression, unspecified depression type Depression is when you feel down, blue or sad for at least 2 weeks  in a row. You may experience increaset increase in sleeping, eating or  loss of interest in doing things that once gave you pleasure. Feeling worthless, guilty, nervous and low self esteem, avoiding interaction with other people or increase agitation.   Follow Up Instructions:    I discussed the assessment and treatment plan with the patient. The patient was provided an opportunity to ask questions and all were answered. The patient agreed with the plan and demonstrated an understanding of the instructions.   The patient was advised to call back or seek an in-person evaluation if the symptoms worsen or if the condition fails to improve as anticipated.  I provided 15 minutes of non-face-to-face time during this encounter.   Kerin Perna, NP

## 2019-09-29 ENCOUNTER — Encounter (HOSPITAL_COMMUNITY): Payer: Self-pay | Admitting: Emergency Medicine

## 2019-09-29 ENCOUNTER — Other Ambulatory Visit: Payer: Self-pay

## 2019-09-29 ENCOUNTER — Emergency Department (HOSPITAL_COMMUNITY)
Admission: EM | Admit: 2019-09-29 | Discharge: 2019-09-29 | Disposition: A | Discharge status: Home / Self Care | Payer: 59 | Attending: Emergency Medicine | Admitting: Emergency Medicine

## 2019-09-29 DIAGNOSIS — R55 Syncope and collapse: Secondary | ICD-10-CM | POA: Insufficient documentation

## 2019-09-29 DIAGNOSIS — Z20822 Contact with and (suspected) exposure to covid-19: Secondary | ICD-10-CM | POA: Insufficient documentation

## 2019-09-29 DIAGNOSIS — R519 Headache, unspecified: Secondary | ICD-10-CM | POA: Diagnosis not present

## 2019-09-29 LAB — CBC WITH DIFFERENTIAL/PLATELET
Abs Immature Granulocytes: 0.05 10*3/uL (ref 0.00–0.07)
Basophils Absolute: 0 10*3/uL (ref 0.0–0.1)
Basophils Relative: 0 %
Eosinophils Absolute: 0.1 10*3/uL (ref 0.0–0.5)
Eosinophils Relative: 1 %
HCT: 45.2 % (ref 36.0–46.0)
Hemoglobin: 13.5 g/dL (ref 12.0–15.0)
Immature Granulocytes: 0 %
Lymphocytes Relative: 26 %
Lymphs Abs: 3 10*3/uL (ref 0.7–4.0)
MCH: 24.8 pg — ABNORMAL LOW (ref 26.0–34.0)
MCHC: 29.9 g/dL — ABNORMAL LOW (ref 30.0–36.0)
MCV: 82.9 fL (ref 80.0–100.0)
Monocytes Absolute: 0.6 10*3/uL (ref 0.1–1.0)
Monocytes Relative: 6 %
Neutro Abs: 7.6 10*3/uL (ref 1.7–7.7)
Neutrophils Relative %: 67 %
Platelets: 319 10*3/uL (ref 150–400)
RBC: 5.45 MIL/uL — ABNORMAL HIGH (ref 3.87–5.11)
RDW: 16.6 % — ABNORMAL HIGH (ref 11.5–15.5)
WBC: 11.4 10*3/uL — ABNORMAL HIGH (ref 4.0–10.5)
nRBC: 0 % (ref 0.0–0.2)

## 2019-09-29 LAB — URINALYSIS, ROUTINE W REFLEX MICROSCOPIC
Bilirubin Urine: NEGATIVE
Glucose, UA: NEGATIVE mg/dL
Hgb urine dipstick: NEGATIVE
Ketones, ur: NEGATIVE mg/dL
Leukocytes,Ua: NEGATIVE
Nitrite: NEGATIVE
Protein, ur: NEGATIVE mg/dL
Specific Gravity, Urine: 1.02 (ref 1.005–1.030)
pH: 5 (ref 5.0–8.0)

## 2019-09-29 LAB — BASIC METABOLIC PANEL
Anion gap: 10 (ref 5–15)
BUN: 12 mg/dL (ref 6–20)
CO2: 23 mmol/L (ref 22–32)
Calcium: 9.3 mg/dL (ref 8.9–10.3)
Chloride: 104 mmol/L (ref 98–111)
Creatinine, Ser: 0.49 mg/dL (ref 0.44–1.00)
GFR calc Af Amer: >60 mL/min (ref >60)
GFR calc non Af Amer: >60 mL/min (ref >60)
Glucose, Bld: 95 mg/dL (ref 70–99)
Potassium: 4 mmol/L (ref 3.5–5.1)
Sodium: 137 mmol/L (ref 135–145)

## 2019-09-29 LAB — PREGNANCY, URINE: Preg Test, Ur: NEGATIVE

## 2019-09-29 LAB — SARS CORONAVIRUS 2 (TAT 6-24 HRS): SARS Coronavirus 2: NEGATIVE

## 2019-09-29 MED ORDER — METOCLOPRAMIDE HCL 5 MG/ML IJ SOLN
10.0000 mg | Freq: Once | INTRAMUSCULAR | Status: AC
  Administered 2019-09-29: 10 mg via INTRAVENOUS
  Filled 2019-09-29: qty 2

## 2019-09-29 MED ORDER — SODIUM CHLORIDE 0.9 % IV BOLUS
1000.0000 mL | Freq: Once | INTRAVENOUS | Status: AC
  Administered 2019-09-29: 1000 mL via INTRAVENOUS

## 2019-09-29 MED ORDER — KETOROLAC TROMETHAMINE 30 MG/ML IJ SOLN
30.0000 mg | Freq: Once | INTRAMUSCULAR | Status: AC
  Administered 2019-09-29: 30 mg via INTRAVENOUS
  Filled 2019-09-29: qty 1

## 2019-09-29 NOTE — Discharge Instructions (Signed)
Stay hydrated as dehydration may cause you to have episodes of dizziness or lightheadedness.  Your blood pressure is normal today. Your labs are normal as well.  Follow up with primary care provider for further management.  You are safe to go home.

## 2019-09-29 NOTE — ED Triage Notes (Signed)
Patient here from work reporting fever and hypertension. States that she felt "funny" at work and staff took BP and it was elevated. Headache x2 days.

## 2019-09-29 NOTE — ED Provider Notes (Signed)
[facility] DEPT Provider Note   CSN: [id number] Arrival date & time: 09/29/19  1050     History Chief Complaint  Patient presents with  . Hypertension    Amy Mercer is a 24 y.o. female.  The history is provided by the patient. The history is limited by a language barrier. A language interpreter was used.  Hypertension     24 year old female with history of PTSD, social anxiety disorder, presenting complaining of headache and dizziness.  Patient is Spanish-speaking, history obtained through language interpreter.  Patient report she has had history of recurrent headache with dizziness for the past 3 to 4 months.  For the past week she has several episodes where she would endorse throbbing bilateral headache follows with feeling lightheadedness, as if she was going to blackout.  Headache is moderate in severity with associated subjective fever, and chills.  She endorsed today having the same symptoms while she was at work, and was seen by a nurse at her facility who reportedly mentioned that patient has high blood pressure and would want her to come to the ER for further evaluation.  She has been taking ibuprofen at home for symptom with some improvement.  She mentioned having been evaluated by her headache in the past by her PCP and was given medication for that which she no longer have.  She does not complain of any runny nose sneezing or coughing no loss of taste or smell no nausea vomiting diarrhea no light or sound sensitivity or rash.  No report of any neck stiffness.  No focal numbness or focal weakness.  No recent sick contact with anyone with COVID-19  Past Medical History:  Diagnosis Date  . Medical history non-contributory   . PTSD (post-traumatic stress disorder) 08/15/2015  . Social anxiety disorder 08/15/2015    Patient Active Problem List   Diagnosis Date Noted  . MDD (major depressive disorder) 08/15/2015  . Social anxiety  disorder 08/15/2015  . PTSD (post-traumatic stress disorder) 08/15/2015  . Vasovagal syncope 09/16/2014  . Transient right leg weakness 09/16/2014  . Numbness in right leg 09/16/2014  . Obesity 09/16/2014    Past Surgical History:  Procedure Laterality Date  . BACK SURGERY    . OTHER SURGICAL HISTORY  2006   Infected boil removed from the back of her neck and continued needing to have 3 additional surgeries at the age of 72.     OB History    Gravida  0   Para  0   Term  0   Preterm  0   AB  0   Living  0     SAB  0   TAB  0   Ectopic  0   Multiple  0   Live Births  0           Family History  Problem Relation Age of Onset  . Heart attack Paternal Grandmother        Died at 21    Social History   Tobacco Use  . Smoking status: Never Smoker  . Smokeless tobacco: Never Used  Substance Use Topics  . Alcohol use: Yes    Alcohol/week: 0.0 standard drinks    Comment: RARE  . Drug use: No    Home Medications Prior to Admission medications   Medication Sig Start Date End Date Taking? Authorizing Provider  SUMAtriptan (IMITREX) 50 MG tablet Take 1 tablet (50 mg total) by mouth every 2 (two) hours  as needed for migraine. May repeat in 2 hours if headache persists or recurs. 04/26/19   Kerin Perna, NP    Allergies    Other  Review of Systems   Review of Systems  All other systems reviewed and are negative.   Physical Exam Updated Vital Signs BP 105/74   Pulse 73   Temp (!) 96.9 F (36.1 C) (Rectal)   Resp 18   SpO2 100%   Physical Exam Vitals and nursing note reviewed.  Constitutional:      General: She is not in acute distress.    Appearance: She is well-developed. She is obese.  HENT:     Head: Atraumatic.     Right Ear: Tympanic membrane normal.     Left Ear: Tympanic membrane normal.     Nose: Nose normal.     Mouth/Throat:     Mouth: Mucous membranes are moist.  Eyes:     Extraocular Movements: Extraocular movements  intact.     Conjunctiva/sclera: Conjunctivae normal.     Pupils: Pupils are equal, round, and reactive to light.  Cardiovascular:     Rate and Rhythm: Normal rate and regular rhythm.     Pulses: Normal pulses.     Heart sounds: Normal heart sounds.  Pulmonary:     Effort: Pulmonary effort is normal.     Breath sounds: Normal breath sounds. No wheezing, rhonchi or rales.  Abdominal:     Palpations: Abdomen is soft.     Tenderness: There is no abdominal tenderness.  Musculoskeletal:     Cervical back: Normal range of motion and neck supple. No rigidity.  Lymphadenopathy:     Cervical: No cervical adenopathy.  Skin:    Findings: No rash.  Neurological:     Mental Status: She is alert and oriented to person, place, and time.     GCS: GCS eye subscore is 4. GCS verbal subscore is 5. GCS motor subscore is 6.     Cranial Nerves: Cranial nerves are intact.     Sensory: Sensation is intact.     Coordination: Coordination is intact.     Gait: Gait is intact.     ED Results / Procedures / Treatments   Labs (all labs ordered are listed, but only abnormal results are displayed) Labs Reviewed  CBC WITH DIFFERENTIAL/PLATELET - Abnormal; Notable for the following components:      Result Value   WBC 11.4 (*)    RBC 5.45 (*)    MCH 24.8 (*)    MCHC 29.9 (*)    RDW 16.6 (*)    All other components within normal limits  SARS CORONAVIRUS 2 (TAT 6-24 HRS)  BASIC METABOLIC PANEL  PREGNANCY, URINE  URINALYSIS, ROUTINE W REFLEX MICROSCOPIC    EKG EKG Interpretation  Date/Time:  Wednesday September 29 2019 13:21:56 EST Ventricular Rate:  85 PR Interval:    QRS Duration: 106 QT Interval:  386 QTC Calculation: 459 R Axis:   73 Text Interpretation: Sinus rhythm RSR' in V1 or V2, right VCD or RVH No old tracing to compare Confirmed by Dorie Rank [phone]) on 09/29/2019 1:45:09 PM   Radiology No results found.  Procedures Procedures (including critical care time)  Medications Ordered  in ED Medications  sodium chloride 0.9 % bolus 1,000 mL (1,000 mLs Intravenous New Bag/Given 09/29/19 1206)  ketorolac (TORADOL) 30 MG/ML injection 30 mg (30 mg Intravenous Given 09/29/19 1207)  metoCLOPramide (REGLAN) injection 10 mg (10 mg Intravenous Given 09/29/19 1207)  ED Course  I have reviewed the triage vital signs and the nursing notes.  Pertinent labs & imaging results that were available during my care of the patient were reviewed by me and considered in my medical decision making (see chart for details).    MDM Rules/Calculators/A&P                      BP 105/74   Pulse 73   Temp (!) 96.9 F (36.1 C) (Rectal)   Resp 18   SpO2 100%   Final Clinical Impression(s) / ED Diagnoses Final diagnoses:  Near syncope  Recurrent headache    Rx / DC Orders ED Discharge Orders    None     11:50 AM Patient report having recurrent headache, dizziness in which she described more as lightheadedness sensation as well as subjective fever and chills for the past week.  Headache appears to be for the past several months.  She does not have any red flags.  No nuchal rigidity on exam to suggest meningitis.  She is otherwise well-appearing.  Normal blood pressure today.  Work-up initiated.  1:11 PM Labs are reassuring, no electrolyte derangement, UA negative, pregnancy test is negative.  To this tachycardia with orthostatic vital signs however blood pressure remains the same.  She is currently receiving IV fluid.  Will check EKG if normal patient can be discharged.  1:46 PM EKG reviewed interpreted by me show no concerning arrhythmia or ischemic changes.  At this time I recommend patient to stay hydrated.  Gave patient reassurance on her blood pressure.  And encourage patient to follow-up with outpatient PCP for further care.   Domenic Moras, PA-C 09/29/19 1349    Dorie Rank, MD 09/30/19 [phone]

## 2019-10-07 ENCOUNTER — Ambulatory Visit: Payer: BLUE CROSS/BLUE SHIELD | Admitting: Nurse Practitioner

## 2019-10-11 ENCOUNTER — Encounter: Payer: Self-pay | Admitting: Nurse Practitioner

## 2019-10-11 ENCOUNTER — Other Ambulatory Visit: Payer: Self-pay

## 2019-10-11 ENCOUNTER — Ambulatory Visit: Payer: 59 | Attending: Nurse Practitioner | Admitting: Nurse Practitioner

## 2019-10-11 DIAGNOSIS — G43709 Chronic migraine without aura, not intractable, without status migrainosus: Secondary | ICD-10-CM

## 2019-10-11 DIAGNOSIS — Z7689 Persons encountering health services in other specified circumstances: Secondary | ICD-10-CM | POA: Diagnosis not present
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
## 2019-10-13 ENCOUNTER — Other Ambulatory Visit: Payer: Self-pay

## 2019-10-13 ENCOUNTER — Ambulatory Visit: Payer: 59 | Attending: Nurse Practitioner

## 2019-10-13 ENCOUNTER — Other Ambulatory Visit: Payer: Self-pay | Admitting: Nurse Practitioner

## 2019-10-13 DIAGNOSIS — E669 Obesity, unspecified: Secondary | ICD-10-CM

## 2019-10-13 DIAGNOSIS — D72829 Elevated white blood cell count, unspecified: Secondary | ICD-10-CM

## 2019-10-14 LAB — CBC WITH DIFFERENTIAL/PLATELET
Basophils Absolute: 0 10*3/uL (ref 0.0–0.2)
Basos: 0 %
EOS (ABSOLUTE): 0.1 10*3/uL (ref 0.0–0.4)
Eos: 1 %
Hematocrit: 43.7 % (ref 34.0–46.6)
Hemoglobin: 13.6 g/dL (ref 11.1–15.9)
Immature Grans (Abs): 0 10*3/uL (ref 0.0–0.1)
Immature Granulocytes: 1 %
Lymphocytes Absolute: 2.4 10*3/uL (ref 0.7–3.1)
Lymphs: 27 %
MCH: 25 pg — ABNORMAL LOW (ref 26.6–33.0)
MCHC: 31.1 g/dL — ABNORMAL LOW (ref 31.5–35.7)
MCV: 80 fL (ref 79–97)
Monocytes Absolute: 0.6 10*3/uL (ref 0.1–0.9)
Monocytes: 7 %
Neutrophils Absolute: 5.7 10*3/uL (ref 1.4–7.0)
Neutrophils: 64 %
Platelets: 347 10*3/uL (ref 150–450)
RBC: 5.44 x10E6/uL — ABNORMAL HIGH (ref 3.77–5.28)
RDW: 15.5 % — ABNORMAL HIGH (ref 11.7–15.4)
WBC: 8.8 10*3/uL (ref 3.4–10.8)

## 2019-10-14 LAB — LIPID PANEL
Chol/HDL Ratio: 4.7 ratio — ABNORMAL HIGH (ref 0.0–4.4)
Cholesterol, Total: 201 mg/dL — ABNORMAL HIGH (ref 100–199)
HDL: 43 mg/dL (ref >39)
LDL Chol Calc (NIH): 138 mg/dL — ABNORMAL HIGH (ref 0–99)
Triglycerides: 112 mg/dL (ref 0–149)
VLDL Cholesterol Cal: 20 mg/dL (ref 5–40)

## 2019-10-14 LAB — HEMOGLOBIN A1C
Est. average glucose Bld gHb Est-mCnc: 128 mg/dL
Hgb A1c MFr Bld: 6.1 % — ABNORMAL HIGH (ref 4.8–5.6)

## 2019-10-29 ENCOUNTER — Ambulatory Visit: Payer: 59 | Attending: Nurse Practitioner | Admitting: Nurse Practitioner

## 2019-10-29 ENCOUNTER — Encounter: Payer: Self-pay | Admitting: Nurse Practitioner

## 2019-10-29 DIAGNOSIS — G43709 Chronic migraine without aura, not intractable, without status migrainosus: Secondary | ICD-10-CM | POA: Diagnosis not present

## 2019-10-29 MED ORDER — AMITRIPTYLINE HCL 10 MG PO TABS: 10.0000 mg | ORAL_TABLET | Freq: Every day | ORAL | 0 refills | Status: DC
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
## 2019-10-29 NOTE — Patient Instructions (Signed)
Cefalea migraosa Migraine Headache Una cefalea migraosa es un dolor muy intenso y punzante en uno o ambos lados de la cabeza. Este tipo de dolor de cabeza tambin puede causar otros sntomas. Puede durar desde 4horas hasta 3das. Hable con su mdico sobre las cosas que pueden causar (desencadenar) esta afeccin. Cules son las causas? Se desconoce la causa exacta de esta afeccin. Esta afeccin puede desencadenarse o ser causada por lo siguiente:  Consumo de alcohol.  Consumo de cigarrillos.  Tomar medicamentos como por ejemplo: ? Medicamentos para aliviar el dolor torcico (nitroglicerina). ? Anticonceptivos orales. ? Estrgeno. ? Algunos medicamentos para la presin arterial.  Comer o beber ciertos productos.  Hacer actividad fsica. Otros factores que pueden provocar cefalea migraosa son los siguientes:  Tener el perodo menstrual.  Embarazo.  Hambre.  Estrs.  No dormir lo suficiente o dormir demasiado.  Cambios climticos.  Cansancio (fatiga). Qu incrementa el riesgo?  Tener entre 25 y 55aos de edad.  Ser mujer.  Tener antecedentes familiares de cefalea migraosa.  Ser de raza caucsica.  Tener depresin o ansiedad.  Tener mucho sobrepeso. Cules son los signos o los sntomas?  Un dolor punzante. Este dolor puede tener las siguientes caractersticas: ? Puede aparecer en cualquier regin de la cabeza, tanto de un lado como de ambos. ? Puede dificultar las actividades cotidianas. ? Puede empeorar con la actividad fsica. ? Puede empeorar con las luces brillantes o los ruidos fuertes.  Otros sntomas pueden incluir: ? Ganas de vomitar (nuseas). ? Vmitos. ? Mareos. ? Sensibilidad a las luces brillantes, los ruidos fuertes o los olores.  Antes de tener una cefalea migraosa, puede recibir seales de advertencia (aura). Un aura puede incluir: ? Ver luces intermitentes o tener puntos ciegos. ? Ver puntos brillantes, halos o lneas en  zigzag. ? Tener una visin en tnel o visin borrosa. ? Sentir entumecimiento u hormigueo. ? Tener dificultad para hablar. ? Tener msculos dbiles.  Algunas personas tienen sntomas despus de una cefalea migraosa (fase posdromal), como los siguientes: ? Cansancio. ? Dificultad para pensar (concentrarse). Cmo se trata?  Tomar medicamentos para: ? Aliviar el dolor. ? Aliviar la sensacin de malestar estomacal. ? Prevenir las cefaleas migraosas.  El tratamiento tambin puede incluir lo siguiente: ? Tomar sesiones de acupuntura. ? Evitar los alimentos que provocan las cefaleas migraosas. ? Aprender maneras de controlar las funciones corporales (biorretroalimentacin). ? Terapia para ayudarlo a conocer y lidiar con los pensamientos negativos (terapia cognitivo conductual). Siga estas instrucciones en su casa: Medicamentos  Tome los medicamentos de venta libre y los recetados solamente como se lo haya indicado el mdico.  Consulte a su mdico si el medicamento que le recetaron: ? Hace que sea necesario que evite conducir o usar maquinaria pesada. ? Puede causarle dificultad para defecar (estreimiento). Es posible que deba tomar estas medidas para prevenir o tratar los problemas para defecar:  Beber suficiente lquido para mantener el pis (la orina) de color amarillo plido.  Tomar medicamentos recetados o de venta libre.  Comer alimentos ricos en fibra. Entre ellos, frijoles, cereales integrales y frutas y verduras frescas.  Limitar los alimentos con alto contenido de grasa y azcar. Estos incluyen alimentos fritos o dulces. Estilo de vida  No beba alcohol.  No consuma ningn producto que contenga nicotina o tabaco, como cigarrillos, cigarrillos electrnicos y tabaco de mascar. Si necesita ayuda para dejar de fumar, consulte al mdico.  Duerma como mnimo 8horas todas las noches.  Limite el estrs y manjelo. Indicaciones generales        Lleve un registro diario  para averiguar lo que puede provocar las cefaleas migraosas. Registre, por ejemplo, lo siguiente: ? Lo que usted come y bebe. ? El tiempo que duerme. ? Algn cambio en lo que come o bebe. ? Algn cambio en sus medicamentos.  Si tiene una cefalea migraosa: ? Evite los factores que empeoren los sntomas, como las luces brillantes. ? Resulta til acostarse en una habitacin oscura y silenciosa. ? No conduzca vehculos ni opere maquinaria pesada. ? Pregntele al mdico qu actividades son seguras para usted.  Concurra a todas las visitas de seguimiento como se lo haya indicado el mdico. Esto es importante. Comunquese con un mdico si:  Tiene una cefalea migraosa que es diferente o peor que otras que ha tenido.  Tiene ms de 15 das de cefalea por mes. Solicite ayuda inmediatamente si:  La cefalea migraosa empeora mucho.  La cefalea migraosa dura ms de 72 horas.  Tiene fiebre.  Presenta rigidez en el cuello.  Tiene dificultad para ver.  Siente debilidad en los msculos o que no puede controlarlos.  Comienza a perder el equilibrio continuamente.  Comienza a tener dificultad para caminar.  Pierde el conocimiento (se desmaya).  Tiene una convulsin. Resumen  Una cefalea migraosa es un dolor muy intenso y punzante en uno o ambos lados de la cabeza. Estos dolores de cabeza tambin pueden causar otros sntomas.  Esta afeccin puede tratarse con medicamentos y cambios en el estilo de vida.  Lleve un registro diario para averiguar lo que puede provocar las cefaleas migraosas.  Comunquese con un mdico si tiene una cefalea migraosa que es diferente o peor que otras que ha tenido.  Comunquese con el mdico si tiene ms de 15 das de cefalea en un mes. Esta informacin no tiene como fin reemplazar el consejo del mdico. Asegrese de hacerle al mdico cualquier pregunta que tenga. Document Revised: 10/09/2018 Document Reviewed: 10/09/2018 Elsevier Patient Education   2020 Elsevier Inc.  

## 2020-01-14 ENCOUNTER — Ambulatory Visit: Payer: 59 | Admitting: Nurse Practitioner

## 2020-01-30 ENCOUNTER — Other Ambulatory Visit: Payer: Self-pay | Admitting: Nurse Practitioner

## 2020-01-30 DIAGNOSIS — G43709 Chronic migraine without aura, not intractable, without status migrainosus: Secondary | ICD-10-CM

## 2020-05-10 ENCOUNTER — Ambulatory Visit: Payer: 59 | Admitting: Nurse Practitioner

## 2020-06-19 ENCOUNTER — Ambulatory Visit: Payer: 59 | Admitting: Nurse Practitioner

## 2021-09-02 ENCOUNTER — Other Ambulatory Visit: Payer: Self-pay

## 2021-09-02 ENCOUNTER — Emergency Department (HOSPITAL_COMMUNITY): Payer: BLUE CROSS/BLUE SHIELD

## 2021-09-02 ENCOUNTER — Emergency Department (HOSPITAL_COMMUNITY)
Admission: EM | Admit: 2021-09-02 | Discharge: 2021-09-03 | Disposition: A | Discharge status: Home / Self Care | Payer: BLUE CROSS/BLUE SHIELD | Attending: Emergency Medicine | Admitting: Emergency Medicine

## 2021-09-02 ENCOUNTER — Encounter (HOSPITAL_COMMUNITY): Payer: Self-pay

## 2021-09-02 DIAGNOSIS — U071 COVID-19: Secondary | ICD-10-CM | POA: Diagnosis not present

## 2021-09-02 DIAGNOSIS — N939 Abnormal uterine and vaginal bleeding, unspecified: Secondary | ICD-10-CM | POA: Insufficient documentation

## 2021-09-02 DIAGNOSIS — R739 Hyperglycemia, unspecified: Secondary | ICD-10-CM | POA: Diagnosis not present

## 2021-09-02 DIAGNOSIS — N9489 Other specified conditions associated with female genital organs and menstrual cycle: Secondary | ICD-10-CM | POA: Diagnosis not present

## 2021-09-02 MED ORDER — ACETAMINOPHEN 500 MG PO TABS
1000.0000 mg | ORAL_TABLET | Freq: Once | ORAL | Status: AC
  Administered 2021-09-02: 1000 mg via ORAL
  Filled 2021-09-02: qty 2

## 2021-09-02 NOTE — ED Triage Notes (Signed)
Pt reports having the flu x 2 weeks and still having sore throat, and body aches.   Pt also sts no menstrual cycle x 6 months. Took home pregnancy test 3 weeks ago that was positive. Today experienced sudden suprapubic pain and heavy vaginal bleeding.
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
## 2021-09-03 ENCOUNTER — Emergency Department (HOSPITAL_COMMUNITY): Payer: BLUE CROSS/BLUE SHIELD

## 2021-09-03 ENCOUNTER — Encounter (HOSPITAL_COMMUNITY): Payer: Self-pay

## 2021-09-03 LAB — CBC
HCT: 45.5 % (ref 36.0–46.0)
Hemoglobin: 14 g/dL (ref 12.0–15.0)
MCH: 26 pg (ref 26.0–34.0)
MCHC: 30.8 g/dL (ref 30.0–36.0)
MCV: 84.6 fL (ref 80.0–100.0)
Platelets: 255 10*3/uL (ref 150–400)
RBC: 5.38 MIL/uL — ABNORMAL HIGH (ref 3.87–5.11)
RDW: 15.7 % — ABNORMAL HIGH (ref 11.5–15.5)
WBC: 7.6 10*3/uL (ref 4.0–10.5)
nRBC: 0 % (ref 0.0–0.2)

## 2021-09-03 LAB — URINALYSIS, ROUTINE W REFLEX MICROSCOPIC
Bacteria, UA: NONE SEEN
Bilirubin Urine: NEGATIVE
Glucose, UA: >=500 mg/dL — AB
Ketones, ur: NEGATIVE mg/dL
Leukocytes,Ua: NEGATIVE
Nitrite: NEGATIVE
Protein, ur: 30 mg/dL — AB
RBC / HPF: >50 RBC/hpf — ABNORMAL HIGH (ref 0–5)
Specific Gravity, Urine: 1.03 (ref 1.005–1.030)
pH: 5 (ref 5.0–8.0)

## 2021-09-03 LAB — BASIC METABOLIC PANEL
Anion gap: 9 (ref 5–15)
BUN: 8 mg/dL (ref 6–20)
CO2: 24 mmol/L (ref 22–32)
Calcium: 9.4 mg/dL (ref 8.9–10.3)
Chloride: 100 mmol/L (ref 98–111)
Creatinine, Ser: 0.65 mg/dL (ref 0.44–1.00)
GFR, Estimated: >60 mL/min (ref >60)
Glucose, Bld: 341 mg/dL — ABNORMAL HIGH (ref 70–99)
Potassium: 3.9 mmol/L (ref 3.5–5.1)
Sodium: 133 mmol/L — ABNORMAL LOW (ref 135–145)

## 2021-09-03 LAB — RESP PANEL BY RT-PCR (FLU A&B, COVID) ARPGX2
Influenza A by PCR: NEGATIVE
Influenza B by PCR: NEGATIVE
SARS Coronavirus 2 by RT PCR: POSITIVE — AB

## 2021-09-03 LAB — HEMOGLOBIN A1C
Hgb A1c MFr Bld: 12.3 % — ABNORMAL HIGH (ref 4.8–5.6)
Mean Plasma Glucose: 306 mg/dL

## 2021-09-03 LAB — LIPASE, BLOOD: Lipase: 23 U/L (ref 11–51)

## 2021-09-03 LAB — HCG, QUANTITATIVE, PREGNANCY: hCG, Beta Chain, Quant, S: 1 m[IU]/mL (ref <5)

## 2021-09-03 LAB — ABO/RH: ABO/RH(D): O POS

## 2021-09-03 MED ORDER — FENTANYL CITRATE PF 50 MCG/ML IJ SOSY
50.0000 ug | PREFILLED_SYRINGE | Freq: Once | INTRAMUSCULAR | Status: AC
  Administered 2021-09-03: 50 ug via INTRAVENOUS
  Filled 2021-09-03: qty 1

## 2021-09-03 MED ORDER — IOHEXOL 300 MG/ML  SOLN
100.0000 mL | Freq: Once | INTRAMUSCULAR | Status: AC | PRN
  Administered 2021-09-03: 100 mL via INTRAVENOUS

## 2021-09-03 MED ORDER — SODIUM CHLORIDE 0.9 % IV BOLUS
1000.0000 mL | Freq: Once | INTRAVENOUS | Status: AC
  Administered 2021-09-03: 1000 mL via INTRAVENOUS

## 2021-09-03 NOTE — ED Notes (Signed)
Back from CT

## 2021-09-03 NOTE — Discharge Instructions (Signed)
You were evaluated in the Emergency Department and after careful evaluation, we did not find any emergent condition requiring admission or further testing in the hospital.  Your exam/testing today was overall reassuring.  You tested positive for COVID-19.  He also had some elevated blood sugar which needs to be followed up with by your primary care doctor.  Please return to the Emergency Department if you experience any worsening of your condition.  Thank you for allowing [place] to be a part of your care.

## 2022-01-08 ENCOUNTER — Emergency Department (HOSPITAL_COMMUNITY)
Admission: EM | Admit: 2022-01-08 | Discharge: 2022-01-08 | Disposition: A | Discharge status: Home / Self Care | Payer: BLUE CROSS/BLUE SHIELD | Attending: Emergency Medicine | Admitting: Emergency Medicine

## 2022-01-08 ENCOUNTER — Other Ambulatory Visit: Payer: Self-pay

## 2022-01-08 ENCOUNTER — Encounter (HOSPITAL_COMMUNITY): Payer: Self-pay

## 2022-01-08 DIAGNOSIS — M62838 Other muscle spasm: Secondary | ICD-10-CM | POA: Insufficient documentation

## 2022-01-08 MED ORDER — IBUPROFEN 600 MG PO TABS: 600.0000 mg | ORAL_TABLET | Freq: Four times a day (QID) | ORAL | 0 refills | Status: AC | PRN

## 2022-01-08 MED ORDER — CYCLOBENZAPRINE HCL 10 MG PO TABS: 10.0000 mg | ORAL_TABLET | Freq: Three times a day (TID) | ORAL | 0 refills | Status: AC | PRN

## 2022-01-08 MED ORDER — CYCLOBENZAPRINE HCL 10 MG PO TABS
10.0000 mg | ORAL_TABLET | Freq: Once | ORAL | Status: AC
  Administered 2022-01-08: 10 mg via ORAL
  Filled 2022-01-08: qty 1

## 2022-01-08 MED ORDER — KETOROLAC TROMETHAMINE 30 MG/ML IJ SOLN
30.0000 mg | Freq: Once | INTRAMUSCULAR | Status: AC
  Administered 2022-01-08: 30 mg via INTRAMUSCULAR
  Filled 2022-01-08: qty 1

## 2022-01-08 NOTE — ED Triage Notes (Signed)
Pt reports with right sided upper back pain since last night around 9 pm. 10/10 pain.
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
## 2022-11-18 ENCOUNTER — Other Ambulatory Visit: Payer: Self-pay

## 2022-11-18 ENCOUNTER — Emergency Department (HOSPITAL_COMMUNITY)
Admission: EM | Admit: 2022-11-18 | Discharge: 2022-11-18 | Disposition: A | Discharge status: Home / Self Care | Payer: No Typology Code available for payment source | Attending: Emergency Medicine | Admitting: Emergency Medicine

## 2022-11-18 DIAGNOSIS — R350 Frequency of micturition: Secondary | ICD-10-CM | POA: Diagnosis not present

## 2022-11-18 DIAGNOSIS — N898 Other specified noninflammatory disorders of vagina: Secondary | ICD-10-CM | POA: Diagnosis present

## 2022-11-18 DIAGNOSIS — N760 Acute vaginitis: Secondary | ICD-10-CM | POA: Insufficient documentation

## 2022-11-18 LAB — URINALYSIS, ROUTINE W REFLEX MICROSCOPIC
Bilirubin Urine: NEGATIVE
Glucose, UA: >=500 mg/dL — AB
Hgb urine dipstick: NEGATIVE
Ketones, ur: NEGATIVE mg/dL
Nitrite: NEGATIVE
Protein, ur: NEGATIVE mg/dL
Specific Gravity, Urine: 1.033 — ABNORMAL HIGH (ref 1.005–1.030)
pH: 5 (ref 5.0–8.0)

## 2022-11-18 LAB — WET PREP, GENITAL
Clue Cells Wet Prep HPF POC: NONE SEEN
Sperm: NONE SEEN
Trich, Wet Prep: NONE SEEN
WBC, Wet Prep HPF POC: >=10 — AB (ref <10)
Yeast Wet Prep HPF POC: NONE SEEN

## 2022-11-18 MED ORDER — FLUCONAZOLE 150 MG PO TABS
150.0000 mg | ORAL_TABLET | Freq: Once | ORAL | Status: AC
  Administered 2022-11-18: 150 mg via ORAL
  Filled 2022-11-18: qty 1

## 2022-11-18 MED ORDER — ZINC OXIDE 12.8 % EX OINT
TOPICAL_OINTMENT | CUTANEOUS | Status: DC | PRN
  Filled 2022-11-18: qty 56.7

## 2022-11-18 MED ORDER — ZINC OXIDE 20 % EX OINT: 1.0000 | TOPICAL_OINTMENT | CUTANEOUS | 1 refills | Status: AC | PRN

## 2022-11-18 NOTE — ED Triage Notes (Signed)
C/o burning with urination. Urinary frequency,  and itching to vagina with white discharge x3 weeks.  Denies unprotected sex.

## 2022-11-18 NOTE — ED Provider Notes (Signed)
[place] [facility]ERGENCY DEPARTMENT AT [facility] Provider Note   CSN: [id number] Arrival date & time: 11/18/22  9233     History  Chief Complaint  Patient presents with   Vaginal Itching   Urinary Frequency    Amy Mercer is a 27 y.o. female.  HPI    27 year old female comes in with chief complaint of vaginal itching, burning and anogenital burning with some bleeding.  Patient states that her symptoms have been present for 3 weeks.  She is not having unprotected intercourse, and her partner is reliable.  She has no history of STD.  She states that she has a rash in the groin region and it extends to the anal region.  She has pain both in the vaginal and anal region.  The pain started in the vaginal region first, 4 days after it moved to the anal region.  She notices some blood when wiping her buttock.  Patient states that she works in the [employer].  She has to clean in a very hot area, and often is swelling while cleaning the region.  She has tried 4 days of antifungal medication without any relief.   Home Medications Prior to Admission medications   Medication Sig Start Date End Date Taking? Authorizing Provider  zinc oxide (MEIJER ZINC OXIDE) 20 % ointment Apply 1 Application topically as needed for irritation. 11/18/22  Yes Tavian Callander, MD  amitriptyline (ELAVIL) 10 MG tablet TAKE 1 TABLET(10 MG) BY MOUTH AT BEDTIME 02/01/20   Hoy Register, MD  Cholecalciferol (VITAMIN D3) 50 MCG (2000 UT) TABS Take 2,000 Units by mouth 2 (two) times daily.    [provider]  cyclobenzaprine (FLEXERIL) 10 MG tablet Take 1 tablet (10 mg total) by mouth 3 (three) times daily as needed for muscle spasms. 01/08/22   Roxy Horseman, PA-C  ibuprofen (ADVIL) 600 MG tablet Take 1 tablet (600 mg total) by mouth every 6 (six) hours as needed. 01/08/22   Roxy Horseman, PA-C      Allergies    Other    Review of Systems   Review of Systems  All  other systems reviewed and are negative.   Physical Exam Updated Vital Signs BP (!) 140/101   Pulse 77   Temp 98.5 F (36.9 C)   Resp 20   Wt 116 kg   LMP 10/18/2022   SpO2 100%   BMI 42.56 kg/m  Physical Exam Vitals and nursing note reviewed. Exam conducted with a chaperone present.  Constitutional:      Appearance: She is well-developed.  HENT:     Head: Normocephalic and atraumatic.  Eyes:     Extraocular Movements: Extraocular movements intact.     Conjunctiva/sclera: Conjunctivae normal.  Cardiovascular:     Rate and Rhythm: Normal rate and regular rhythm.     Heart sounds: Normal heart sounds.  Pulmonary:     Effort: Pulmonary effort is normal. No respiratory distress.  Abdominal:     General: Bowel sounds are normal. There is no distension.     Palpations: Abdomen is soft.     Tenderness: There is no abdominal tenderness. There is no guarding or rebound.  Genitourinary:    Vagina: Normal.     Comments: External exam - pt has erythematous patch around the vulvovaginal area and anogenital area, between the gluteal folds. Speculum exam: Pt has some white discharge and she was tender with exam  I did not appreciate vesicular rash, crusted lesions consistent with  herpes. Musculoskeletal:     Cervical back: Normal range of motion and neck supple.  Skin:    General: Skin is warm and dry.  Neurological:     Mental Status: She is alert and oriented to person, place, and time.   Chaperone was present for pelvic exam.  ED Results / Procedures / Treatments   Labs (all labs ordered are listed, but only abnormal results are displayed) Labs Reviewed  WET PREP, GENITAL - Abnormal; Notable for the following components:      Result Value   WBC, Wet Prep HPF POC >=10 (*)    All other components within normal limits  URINALYSIS, ROUTINE W REFLEX MICROSCOPIC  HSV 1/2 PCR (SURFACE)  HSV 1/2 PCR (SURFACE)  GC/CHLAMYDIA PROBE AMP ([place]) NOT AT [facility]     EKG None  Radiology No results found.  Procedures Procedures    Medications Ordered in ED Medications  Zinc Oxide (TRIPLE PASTE) 12.8 % ointment (has no administration in time range)  fluconazole (DIFLUCAN) tablet 150 mg (has no administration in time range)    ED Course/ Medical Decision Making/ A&P                             Medical Decision Making Amount and/or Complexity of Data Reviewed Labs: ordered.  Risk OTC drugs. Prescription drug management.    Final Clinical Impression(s) / ED Diagnoses Final diagnoses:  Acute vulvovaginitis    Rx / DC Orders ED Discharge Orders          Ordered    zinc oxide [facility] ZINC OXIDE) 20 % ointment  As needed        11/18/22 0931              Derwood Kaplan, MD 11/18/22 [phone]

## 2022-11-18 NOTE — Discharge Instructions (Addendum)
Please apply the ointment prescribed twice a day at the affected area.  Also apply Vaseline ointment generously to that area.  Apply wiping that region heavily with tissue paper for now.

## 2022-11-19 LAB — GC/CHLAMYDIA PROBE AMP (~~LOC~~) NOT AT ARMC
Chlamydia: NEGATIVE
Comment: NEGATIVE
Comment: NORMAL
Neisseria Gonorrhea: NEGATIVE

## 2022-11-19 LAB — HSV 1/2 PCR (SURFACE)
HSV-1 DNA: NOT DETECTED
HSV-2 DNA: NOT DETECTED

## 2023-03-26 ENCOUNTER — Emergency Department (HOSPITAL_COMMUNITY)
Admission: EM | Admit: 2023-03-26 | Discharge: 2023-03-26 | Disposition: A | Discharge status: Home / Self Care | Payer: No Typology Code available for payment source | Attending: Emergency Medicine | Admitting: Emergency Medicine

## 2023-03-26 ENCOUNTER — Other Ambulatory Visit: Payer: Self-pay

## 2023-03-26 DIAGNOSIS — N3000 Acute cystitis without hematuria: Secondary | ICD-10-CM | POA: Insufficient documentation

## 2023-03-26 DIAGNOSIS — N939 Abnormal uterine and vaginal bleeding, unspecified: Secondary | ICD-10-CM

## 2023-03-26 LAB — HCG, SERUM, QUALITATIVE: Preg, Serum: NEGATIVE

## 2023-03-26 LAB — URINALYSIS, ROUTINE W REFLEX MICROSCOPIC
Bacteria, UA: NONE SEEN
Bilirubin Urine: NEGATIVE
Glucose, UA: 50 mg/dL — AB
Ketones, ur: 5 mg/dL — AB
Nitrite: NEGATIVE
Protein, ur: 100 mg/dL — AB
RBC / HPF: >50 RBC/hpf (ref 0–5)
Specific Gravity, Urine: 1.021 (ref 1.005–1.030)
WBC, UA: >50 WBC/hpf (ref 0–5)
pH: 6 (ref 5.0–8.0)

## 2023-03-26 LAB — CBC
HCT: 43.3 % (ref 36.0–46.0)
Hemoglobin: 13.8 g/dL (ref 12.0–15.0)
MCH: 27.8 pg (ref 26.0–34.0)
MCHC: 31.9 g/dL (ref 30.0–36.0)
MCV: 87.3 fL (ref 80.0–100.0)
Platelets: 255 10*3/uL (ref 150–400)
RBC: 4.96 MIL/uL (ref 3.87–5.11)
RDW: 13.6 % (ref 11.5–15.5)
WBC: 7.9 10*3/uL (ref 4.0–10.5)
nRBC: 0 % (ref 0.0–0.2)

## 2023-03-26 LAB — WET PREP, GENITAL
Clue Cells Wet Prep HPF POC: NONE SEEN
Sperm: NONE SEEN
Trich, Wet Prep: NONE SEEN
WBC, Wet Prep HPF POC: <10 (ref <10)
Yeast Wet Prep HPF POC: NONE SEEN

## 2023-03-26 MED ORDER — CEFTRIAXONE SODIUM 1 G IJ SOLR: 500.0000 mg | Freq: Once | INTRAMUSCULAR | Status: DC

## 2023-03-26 MED ORDER — NAPROXEN 500 MG PO TABS
500.0000 mg | ORAL_TABLET | Freq: Once | ORAL | Status: AC
  Administered 2023-03-26: 500 mg via ORAL
  Filled 2023-03-26: qty 1

## 2023-03-26 MED ORDER — DOXYCYCLINE HYCLATE 100 MG PO TABS: 100.0000 mg | ORAL_TABLET | Freq: Once | ORAL | Status: DC

## 2023-03-26 MED ORDER — CEPHALEXIN 500 MG PO CAPS: 500.0000 mg | ORAL_CAPSULE | Freq: Two times a day (BID) | ORAL | 0 refills | Status: AC

## 2023-03-26 MED ORDER — PHENAZOPYRIDINE HCL 200 MG PO TABS: 200.0000 mg | ORAL_TABLET | Freq: Three times a day (TID) | ORAL | 0 refills | Status: AC

## 2023-03-26 MED ORDER — CEPHALEXIN 500 MG PO CAPS
500.0000 mg | ORAL_CAPSULE | Freq: Once | ORAL | Status: AC
  Administered 2023-03-26: 500 mg via ORAL
  Filled 2023-03-26: qty 1

## 2023-03-26 NOTE — ED Provider Notes (Signed)
[place] EM[facility] AT [facility] Provider Note   CSN: [id number] Arrival date & time: 03/26/23  1505     History  Chief Complaint  Patient presents with   Vaginal Bleeding    Amy Mercer is a 27 y.o. female with a history of depression, vasovagal syncope, and obesity who presents the ED today for vaginal bleeding and pelvic pain.  Patient reports vaginal bleeding for the past 2 weeks.  She uses about 6 pads per day.  Denies any weakness or dizziness.  Additionally patient reports associated pain to the lower abdomen and pain with urination.  Patient denies any aggravating or alleviating factors.  She reports he sexually active with 1 partner and they do not use condoms.  No fever, nausea, or vomiting.  No other complaints or concerns at this time.    Home Medications Prior to Admission medications   Medication Sig Start Date End Date Taking? Authorizing Provider  cephALEXin (KEFLEX) 500 MG capsule Take 1 capsule (500 mg total) by mouth 2 (two) times daily for 7 days. 03/26/23 04/02/23 Yes Maxwell Marion, PA-C  phenazopyridine (PYRIDIUM) 200 MG tablet Take 1 tablet (200 mg total) by mouth 3 (three) times daily for 7 days. 03/26/23 04/02/23 Yes Maxwell Marion, PA-C  amitriptyline (ELAVIL) 10 MG tablet TAKE 1 TABLET(10 MG) BY MOUTH AT BEDTIME 02/01/20   Hoy Register, MD  Cholecalciferol (VITAMIN D3) 50 MCG (2000 UT) TABS Take 2,000 Units by mouth 2 (two) times daily.    [provider]  cyclobenzaprine (FLEXERIL) 10 MG tablet Take 1 tablet (10 mg total) by mouth 3 (three) times daily as needed for muscle spasms. 01/08/22   Roxy Horseman, PA-C  ibuprofen (ADVIL) 600 MG tablet Take 1 tablet (600 mg total) by mouth every 6 (six) hours as needed. 01/08/22   Roxy Horseman, PA-C  zinc oxide [facility] ZINC OXIDE) 20 % ointment Apply 1 Application topically as needed for irritation. 11/18/22   Derwood Kaplan, MD      Allergies    Other    Review of  Systems   Review of Systems  Genitourinary:  Positive for vaginal bleeding.  All other systems reviewed and are negative.   Physical Exam Updated Vital Signs BP (!) 156/118   Pulse 71   Temp 98.5 F (36.9 C) (Oral)   Resp 18   Ht 5\' 5"  (1.651 m)   Wt 116 kg   LMP 12/31/2022 (Approximate)   SpO2 100%   BMI 42.56 kg/m  Physical Exam Vitals and nursing note reviewed. Exam conducted with a chaperone present Gaetano Net RN).  Constitutional:      Appearance: Normal appearance.  HENT:     Head: Normocephalic and atraumatic.     Mouth/Throat:     Mouth: Mucous membranes are moist.  Eyes:     Conjunctiva/sclera: Conjunctivae normal.     Pupils: Pupils are equal, round, and reactive to light.  Cardiovascular:     Rate and Rhythm: Normal rate and regular rhythm.     Pulses: Normal pulses.  Pulmonary:     Effort: Pulmonary effort is normal.     Breath sounds: Normal breath sounds.  Abdominal:     Palpations: Abdomen is soft.     Tenderness: There is no abdominal tenderness.  Genitourinary:    Vagina: Bleeding present.     Cervix: Cervical bleeding present.     Uterus: Tender.      Adnexa:        Left:  Tenderness present.   Skin:    General: Skin is warm and dry.     Findings: No rash.  Neurological:     General: No focal deficit present.     Mental Status: She is alert.  Psychiatric:        Mood and Affect: Mood normal.        Behavior: Behavior normal.     ED Results / Procedures / Treatments   Labs (all labs ordered are listed, but only abnormal results are displayed) Labs Reviewed  URINALYSIS, ROUTINE W REFLEX MICROSCOPIC - Abnormal; Notable for the following components:      Result Value   Color, Urine RED (*)    APPearance CLOUDY (*)    Glucose, UA 50 (*)    Hgb urine dipstick LARGE (*)    Ketones, ur 5 (*)    Protein, ur 100 (*)    Leukocytes,Ua SMALL (*)    All other components within normal limits  WET PREP, GENITAL  HCG, SERUM, QUALITATIVE  CBC   GC/CHLAMYDIA PROBE AMP ([place]) NOT AT [facility]    EKG None  Radiology No results found.  Procedures Procedures: not indicated.   Medications Ordered in ED Medications  naproxen (NAPROSYN) tablet 500 mg (500 mg Oral Given 03/26/23 1743)  cephALEXin (KEFLEX) capsule 500 mg (500 mg Oral Given 03/26/23 2052)    ED Course/ Medical Decision Making/ A&P                                 Medical Decision Making Amount and/or Complexity of Data Reviewed Labs: ordered.  Risk Prescription drug management.   This patient presents to the ED for concern of pelvic pain and vaginal bleeding x2 weeks, this involves an extensive number of treatment options, and is a complaint that carries with it a high risk of complications and morbidity.   Differential diagnosis includes: Pregnancy, abnormal uterine bleeding, STI, UTI, etc.   Comorbidities  See HPI above   Additional History  Additional history obtained from patient's previous records   Lab Tests  I ordered and personally interpreted labs.  The pertinent results include:   CBC is unremarkable no acute anemia or infection Serum pregnancy is negative UA shows small leukocytes without bacteria present.   Wet prep was negative Gonorrhea and Chlamydia testing pending.   Problem List / ED Course / Critical Interventions / Medication Management  Pelvic pain and vaginal bleeding x 2 weeks I ordered medications including: Naproxen for pain Keflex for UTI treatment Reevaluation of the patient after these medicines showed that the patient improved With shared decision making, patient and I agreed to treat her UTI.  She will return to the ED for treatment if STI results come back positive in the next several days. I have reviewed the patients home medicines and have made adjustments as needed   Social Determinants of Health  Access to healthcare   Test / Admission - Considered  Discussed results with patient.  All  questions were answered. She is hemodynamically stable and safe for discharge home. Information for gynecology provided for patient to follow-up with.   Prescription for Keflex and Pyridium sent to the pharmacy. Return precautions provided.       Final Clinical Impression(s) / ED Diagnoses Final diagnoses:  Acute cystitis without hematuria  Vaginal bleeding    Rx / DC Orders ED Discharge Orders  Ordered    cephALEXin (KEFLEX) 500 MG capsule  2 times daily        03/26/23 2046    phenazopyridine (PYRIDIUM) 200 MG tablet  3 times daily        03/26/23 2046              Maxwell Marion, PA-C 03/26/23 2252    Lorre Nick, MD 03/31/23 [phone]

## 2023-03-26 NOTE — Discharge Instructions (Addendum)
As discussed, you have a urinary infection.  We have given you the first dose of medication tonight.  I want you to take Keflex twice a day for the next 7 days.  Please complete entirety of the medication.  I have also prescribed you Pyridium.  You can take this up to 3 times a day as for pain with urination.  You will receive a call from the hospital if your STD testing is positive. Then you will need to come back to receive treatment.  I have provided information for a gynecologist.  Call them tomorrow to schedule an appointment to follow-up about your uterine bleeding.  Get help right away if: You faint. You have to change pads every hour. You have pain in your belly. You have a fever or chills. You get sweaty or weak. You pass large blood clots from your vagina.

## 2023-03-26 NOTE — ED Triage Notes (Signed)
Pt reports pelvic pain and vaginal bleeding x 2 weeks. Pt states she has to change her pad every 30 min.

## 2023-03-27 LAB — GC/CHLAMYDIA PROBE AMP (~~LOC~~) NOT AT ARMC
Chlamydia: NEGATIVE
Comment: NEGATIVE
Comment: NORMAL
Neisseria Gonorrhea: NEGATIVE
